# Patient Record
Sex: Female | Born: 1950 | Race: White | Hispanic: No | Smoking: Never smoker
Health system: Southern US, Community
[De-identification: ages and names within clinical notes are randomized; demographics above are authoritative.]

## PROBLEM LIST (undated history)

## (undated) DIAGNOSIS — T884XXA Failed or difficult intubation, initial encounter: Secondary | ICD-10-CM

## (undated) DIAGNOSIS — G4733 Obstructive sleep apnea (adult) (pediatric): Secondary | ICD-10-CM

## (undated) DIAGNOSIS — J45909 Unspecified asthma, uncomplicated: Secondary | ICD-10-CM

## (undated) DIAGNOSIS — L719 Rosacea, unspecified: Secondary | ICD-10-CM

## (undated) DIAGNOSIS — J189 Pneumonia, unspecified organism: Secondary | ICD-10-CM

## (undated) DIAGNOSIS — I499 Cardiac arrhythmia, unspecified: Secondary | ICD-10-CM

## (undated) DIAGNOSIS — E785 Hyperlipidemia, unspecified: Secondary | ICD-10-CM

## (undated) DIAGNOSIS — J40 Bronchitis, not specified as acute or chronic: Secondary | ICD-10-CM

## (undated) DIAGNOSIS — G2581 Restless legs syndrome: Secondary | ICD-10-CM

## (undated) DIAGNOSIS — F329 Major depressive disorder, single episode, unspecified: Secondary | ICD-10-CM

## (undated) DIAGNOSIS — F32A Depression, unspecified: Secondary | ICD-10-CM

## (undated) DIAGNOSIS — F419 Anxiety disorder, unspecified: Secondary | ICD-10-CM

## (undated) DIAGNOSIS — I10 Essential (primary) hypertension: Secondary | ICD-10-CM

## (undated) DIAGNOSIS — K219 Gastro-esophageal reflux disease without esophagitis: Secondary | ICD-10-CM

## (undated) HISTORY — PX: DILATION AND CURETTAGE OF UTERUS: SHX78

## (undated) HISTORY — PX: TONSILLECTOMY: SUR1361

## (undated) HISTORY — DX: Hyperlipidemia, unspecified: E78.5

## (undated) HISTORY — DX: Rosacea, unspecified: L71.9

## (undated) HISTORY — PX: CARDIAC ELECTROPHYSIOLOGY MAPPING AND ABLATION: SHX1292

## (undated) HISTORY — DX: Restless legs syndrome: G25.81

## (undated) HISTORY — PX: CARPAL TUNNEL RELEASE: SHX101

## (undated) HISTORY — DX: Obstructive sleep apnea (adult) (pediatric): G47.33

## (undated) HISTORY — PX: CHOLECYSTECTOMY: SHX55

---

## 1998-12-11 ENCOUNTER — Encounter: Payer: Self-pay | Admitting: Emergency Medicine

## 1998-12-11 ENCOUNTER — Ambulatory Visit (HOSPITAL_COMMUNITY): Admission: RE | Admit: 1998-12-11 | Discharge: 1998-12-11 | Payer: Self-pay | Admitting: Emergency Medicine

## 1999-03-08 ENCOUNTER — Encounter (INDEPENDENT_AMBULATORY_CARE_PROVIDER_SITE_OTHER): Payer: Self-pay | Admitting: Specialist

## 1999-03-08 ENCOUNTER — Observation Stay (HOSPITAL_COMMUNITY): Admission: RE | Admit: 1999-03-08 | Discharge: 1999-03-09 | Payer: Self-pay

## 1999-10-08 ENCOUNTER — Ambulatory Visit: Admission: RE | Admit: 1999-10-08 | Discharge: 1999-10-08 | Payer: Self-pay | Admitting: Emergency Medicine

## 2005-03-24 ENCOUNTER — Encounter: Admission: RE | Admit: 2005-03-24 | Discharge: 2005-03-24 | Payer: Self-pay | Admitting: Internal Medicine

## 2005-11-26 ENCOUNTER — Other Ambulatory Visit: Admission: RE | Admit: 2005-11-26 | Discharge: 2005-11-26 | Payer: Self-pay | Admitting: Obstetrics and Gynecology

## 2006-06-18 ENCOUNTER — Emergency Department (HOSPITAL_COMMUNITY): Admission: EM | Admit: 2006-06-18 | Discharge: 2006-06-18 | Payer: Self-pay | Admitting: Emergency Medicine

## 2006-07-28 DIAGNOSIS — I499 Cardiac arrhythmia, unspecified: Secondary | ICD-10-CM | POA: Diagnosis present

## 2006-07-28 HISTORY — DX: Cardiac arrhythmia, unspecified: I49.9

## 2006-07-30 ENCOUNTER — Encounter: Admission: RE | Admit: 2006-07-30 | Discharge: 2006-07-30 | Payer: Self-pay | Admitting: Internal Medicine

## 2009-03-26 ENCOUNTER — Encounter: Admission: RE | Admit: 2009-03-26 | Discharge: 2009-03-26 | Payer: Self-pay | Admitting: Internal Medicine

## 2009-11-30 ENCOUNTER — Other Ambulatory Visit: Admission: RE | Admit: 2009-11-30 | Discharge: 2009-11-30 | Payer: Self-pay | Admitting: Obstetrics and Gynecology

## 2010-03-21 ENCOUNTER — Ambulatory Visit (HOSPITAL_COMMUNITY): Admission: RE | Admit: 2010-03-21 | Discharge: 2010-03-21 | Payer: Self-pay | Admitting: Gastroenterology

## 2010-07-28 HISTORY — PX: SHOULDER ARTHROSCOPY: SHX128

## 2010-08-18 ENCOUNTER — Encounter: Payer: Self-pay | Admitting: Internal Medicine

## 2010-10-11 LAB — GLUCOSE, CAPILLARY: Glucose-Capillary: 144 mg/dL — ABNORMAL HIGH (ref 70–99)

## 2012-01-30 ENCOUNTER — Other Ambulatory Visit (HOSPITAL_COMMUNITY)
Admission: RE | Admit: 2012-01-30 | Discharge: 2012-01-30 | Disposition: A | Discharge status: Home / Self Care | Payer: BC Managed Care – PPO | Source: Ambulatory Visit | Attending: Obstetrics and Gynecology | Admitting: Obstetrics and Gynecology

## 2012-01-30 DIAGNOSIS — Z01419 Encounter for gynecological examination (general) (routine) without abnormal findings: Secondary | ICD-10-CM | POA: Insufficient documentation

## 2012-02-02 ENCOUNTER — Other Ambulatory Visit: Payer: Self-pay | Admitting: Obstetrics and Gynecology

## 2012-02-27 ENCOUNTER — Encounter (HOSPITAL_COMMUNITY): Payer: Self-pay

## 2012-03-03 ENCOUNTER — Other Ambulatory Visit: Payer: Self-pay

## 2012-03-03 ENCOUNTER — Encounter (HOSPITAL_COMMUNITY): Payer: Self-pay

## 2012-03-03 ENCOUNTER — Other Ambulatory Visit (HOSPITAL_COMMUNITY): Payer: Self-pay | Admitting: Internal Medicine

## 2012-03-03 ENCOUNTER — Other Ambulatory Visit: Payer: Self-pay | Admitting: Obstetrics and Gynecology

## 2012-03-03 ENCOUNTER — Inpatient Hospital Stay (HOSPITAL_COMMUNITY): Admission: RE | Admit: 2012-03-03 | Payer: BC Managed Care – PPO | Source: Ambulatory Visit

## 2012-03-03 ENCOUNTER — Encounter (HOSPITAL_COMMUNITY)
Admission: RE | Admit: 2012-03-03 | Discharge: 2012-03-03 | Disposition: A | Discharge status: Home / Self Care | Payer: BC Managed Care – PPO | Source: Ambulatory Visit | Attending: Obstetrics and Gynecology | Admitting: Obstetrics and Gynecology

## 2012-03-03 DIAGNOSIS — Z1231 Encounter for screening mammogram for malignant neoplasm of breast: Secondary | ICD-10-CM

## 2012-03-03 HISTORY — DX: Essential (primary) hypertension: I10

## 2012-03-03 HISTORY — DX: Major depressive disorder, single episode, unspecified: F32.9

## 2012-03-03 HISTORY — DX: Anxiety disorder, unspecified: F41.9

## 2012-03-03 HISTORY — DX: Unspecified asthma, uncomplicated: J45.909

## 2012-03-03 HISTORY — DX: Cardiac arrhythmia, unspecified: I49.9

## 2012-03-03 HISTORY — DX: Depression, unspecified: F32.A

## 2012-03-03 LAB — BASIC METABOLIC PANEL
CO2: 31 mEq/L (ref 19–32)
Calcium: 9.9 mg/dL (ref 8.4–10.5)
GFR calc non Af Amer: >90 mL/min (ref >90)
Potassium: 4.5 mEq/L (ref 3.5–5.1)
Sodium: 138 mEq/L (ref 135–145)

## 2012-03-03 LAB — CBC
MCH: 31.3 pg (ref 26.0–34.0)
Platelets: 173 10*3/uL (ref 150–400)
RBC: 3.83 MIL/uL — ABNORMAL LOW (ref 3.87–5.11)

## 2012-03-03 NOTE — Patient Instructions (Signed)
Your procedure is scheduled ZO:[id number]  Enter through the Main Entrance at :0945 am Pick up desk phone and dial [phone] and inform us of your arrival.  Please call [phone] if you have any problems the morning of surgery.  Remember: Do not eat after midnight:tonight Do not drink after:7am Thursday  Take these meds the morning of surgery with a sip of water:Metoprolol, Citalopram, Quinapril----HOLD METFORMIN FOR 24 HOURS PRIOR TO SURGERY  DO NOT wear jewelry, eye make-up, lipstick,body lotion, or dark fingernail polish. Do not shave for 48 hours prior to surgery. Patients discharged on the day of surgery will not be allowed to drive home.   Remember to use your Hibiclens as instructed.

## 2012-03-04 ENCOUNTER — Ambulatory Visit (HOSPITAL_COMMUNITY)
Admission: RE | Admit: 2012-03-04 | Discharge: 2012-03-04 | Disposition: A | Discharge status: Home / Self Care | Payer: BC Managed Care – PPO | Source: Ambulatory Visit | Attending: Obstetrics and Gynecology | Admitting: Obstetrics and Gynecology

## 2012-03-04 ENCOUNTER — Encounter (HOSPITAL_COMMUNITY): Payer: Self-pay | Admitting: Anesthesiology

## 2012-03-04 ENCOUNTER — Ambulatory Visit (HOSPITAL_COMMUNITY): Payer: BC Managed Care – PPO | Admitting: Anesthesiology

## 2012-03-04 ENCOUNTER — Encounter (HOSPITAL_COMMUNITY)
Admission: RE | Disposition: A | Discharge status: Home / Self Care | Payer: Self-pay | Source: Ambulatory Visit | Attending: Obstetrics and Gynecology

## 2012-03-04 DIAGNOSIS — Z9889 Other specified postprocedural states: Secondary | ICD-10-CM

## 2012-03-04 DIAGNOSIS — Z01818 Encounter for other preprocedural examination: Secondary | ICD-10-CM | POA: Insufficient documentation

## 2012-03-04 DIAGNOSIS — N950 Postmenopausal bleeding: Secondary | ICD-10-CM | POA: Insufficient documentation

## 2012-03-04 DIAGNOSIS — Z01812 Encounter for preprocedural laboratory examination: Secondary | ICD-10-CM | POA: Insufficient documentation

## 2012-03-04 HISTORY — PX: HYSTEROSCOPY WITH D & C: SHX1775

## 2012-03-04 LAB — URINALYSIS, ROUTINE W REFLEX MICROSCOPIC
Bilirubin Urine: NEGATIVE
Hgb urine dipstick: NEGATIVE
Ketones, ur: NEGATIVE mg/dL
Specific Gravity, Urine: 1.025 (ref 1.005–1.030)
Urobilinogen, UA: 0.2 mg/dL (ref 0.0–1.0)
pH: 5.5 (ref 5.0–8.0)

## 2012-03-04 LAB — GLUCOSE, CAPILLARY: Glucose-Capillary: 104 mg/dL — ABNORMAL HIGH (ref 70–99)

## 2012-03-04 SURGERY — DILATATION AND CURETTAGE /HYSTEROSCOPY
Anesthesia: General | Site: Uterus | Wound class: Clean Contaminated

## 2012-03-04 MED ORDER — MIDAZOLAM HCL 2 MG/2ML IJ SOLN
INTRAMUSCULAR | Status: AC
  Filled 2012-03-04: qty 2

## 2012-03-04 MED ORDER — BUPIVACAINE HCL (PF) 0.25 % IJ SOLN
INTRAMUSCULAR | Status: AC
  Filled 2012-03-04: qty 30

## 2012-03-04 MED ORDER — MIDAZOLAM HCL 5 MG/5ML IJ SOLN
INTRAMUSCULAR | Status: DC | PRN
  Administered 2012-03-04: 2 mg via INTRAVENOUS

## 2012-03-04 MED ORDER — KETOROLAC TROMETHAMINE 30 MG/ML IJ SOLN
INTRAMUSCULAR | Status: AC
  Filled 2012-03-04: qty 1

## 2012-03-04 MED ORDER — ONDANSETRON HCL 4 MG/2ML IJ SOLN
INTRAMUSCULAR | Status: DC | PRN
  Administered 2012-03-04: 4 mg via INTRAVENOUS

## 2012-03-04 MED ORDER — FENTANYL CITRATE 0.05 MG/ML IJ SOLN
INTRAMUSCULAR | Status: AC
  Filled 2012-03-04: qty 2

## 2012-03-04 MED ORDER — IBUPROFEN 200 MG PO TABS: 600.0000 mg | ORAL_TABLET | Freq: Four times a day (QID) | ORAL | Status: AC | PRN

## 2012-03-04 MED ORDER — FENTANYL CITRATE 0.05 MG/ML IJ SOLN
INTRAMUSCULAR | Status: DC | PRN
  Administered 2012-03-04: 100 ug via INTRAVENOUS
  Administered 2012-03-04: 25 ug via INTRAVENOUS

## 2012-03-04 MED ORDER — EPHEDRINE 5 MG/ML INJ
INTRAVENOUS | Status: AC
  Filled 2012-03-04: qty 10

## 2012-03-04 MED ORDER — MEPERIDINE HCL 25 MG/ML IJ SOLN: 6.2500 mg | INTRAMUSCULAR | Status: DC | PRN

## 2012-03-04 MED ORDER — PROPOFOL 10 MG/ML IV BOLUS
INTRAVENOUS | Status: DC | PRN
  Administered 2012-03-04: 150 mg via INTRAVENOUS
  Administered 2012-03-04: 50 mg via INTRAVENOUS

## 2012-03-04 MED ORDER — DOXYCYCLINE HYCLATE 100 MG PO TABS: ORAL_TABLET | ORAL | Status: DC

## 2012-03-04 MED ORDER — ONDANSETRON HCL 4 MG/2ML IJ SOLN: 4.0000 mg | Freq: Once | INTRAMUSCULAR | Status: DC | PRN

## 2012-03-04 MED ORDER — BUPIVACAINE HCL (PF) 0.25 % IJ SOLN
INTRAMUSCULAR | Status: DC | PRN
  Administered 2012-03-04: 20 mL

## 2012-03-04 MED ORDER — EPHEDRINE SULFATE 50 MG/ML IJ SOLN
INTRAMUSCULAR | Status: DC | PRN
  Administered 2012-03-04: 5 mg via INTRAVENOUS

## 2012-03-04 MED ORDER — HYDROCODONE-ACETAMINOPHEN 5-325 MG PO TABS
ORAL_TABLET | ORAL | Status: AC
  Administered 2012-03-04: 2 via ORAL
  Filled 2012-03-04: qty 2

## 2012-03-04 MED ORDER — PROPOFOL 10 MG/ML IV EMUL
INTRAVENOUS | Status: AC
  Filled 2012-03-04: qty 20

## 2012-03-04 MED ORDER — HYDROCODONE-ACETAMINOPHEN 5-500 MG PO TABS: ORAL_TABLET | ORAL | Status: DC

## 2012-03-04 MED ORDER — FENTANYL CITRATE 0.05 MG/ML IJ SOLN
25.0000 ug | INTRAMUSCULAR | Status: DC | PRN
  Administered 2012-03-04: 50 ug via INTRAVENOUS

## 2012-03-04 MED ORDER — LACTATED RINGERS IV SOLN
INTRAVENOUS | Status: DC
  Administered 2012-03-04 (×2): via INTRAVENOUS

## 2012-03-04 MED ORDER — HYDROCODONE-ACETAMINOPHEN 5-325 MG PO TABS
2.0000 | ORAL_TABLET | Freq: Once | ORAL | Status: AC
  Administered 2012-03-04: 2 via ORAL

## 2012-03-04 MED ORDER — KETOROLAC TROMETHAMINE 30 MG/ML IJ SOLN
INTRAMUSCULAR | Status: DC | PRN
  Administered 2012-03-04: 30 mg via INTRAVENOUS

## 2012-03-04 MED ORDER — KETOROLAC TROMETHAMINE 30 MG/ML IJ SOLN: 15.0000 mg | Freq: Once | INTRAMUSCULAR | Status: DC | PRN

## 2012-03-04 MED ORDER — SILVER NITRATE-POT NITRATE 75-25 % EX MISC
CUTANEOUS | Status: AC
  Filled 2012-03-04: qty 2

## 2012-03-04 SURGICAL SUPPLY — 17 items
CANISTER SUCTION 2500CC (MISCELLANEOUS) ×2 IMPLANT
CATH ROBINSON RED A/P 16FR (CATHETERS) ×2 IMPLANT
CLOTH BEACON ORANGE TIMEOUT ST (SAFETY) ×2 IMPLANT
CONTAINER PREFILL 10% NBF 60ML (FORM) ×4 IMPLANT
DILATOR CANAL MILEX (MISCELLANEOUS) ×1 IMPLANT
ELECT REM PT RETURN 9FT ADLT (ELECTROSURGICAL) ×2
ELECTRODE REM PT RTRN 9FT ADLT (ELECTROSURGICAL) ×1 IMPLANT
GLOVE BIOGEL M 6.5 STRL (GLOVE) ×2 IMPLANT
GLOVE BIOGEL PI IND STRL 6.5 (GLOVE) ×2 IMPLANT
GLOVE BIOGEL PI INDICATOR 6.5 (GLOVE) ×3
GOWN PREVENTION PLUS LG XLONG (DISPOSABLE) ×2 IMPLANT
GOWN PREVENTION PLUS XLARGE (GOWN DISPOSABLE) ×2 IMPLANT
GOWN STRL REIN XL XLG (GOWN DISPOSABLE) ×2 IMPLANT
LOOP ANGLED CUTTING 22FR (CUTTING LOOP) IMPLANT
PACK HYSTEROSCOPY LF (CUSTOM PROCEDURE TRAY) ×2 IMPLANT
TOWEL OR 17X24 6PK STRL BLUE (TOWEL DISPOSABLE) ×4 IMPLANT
WATER STERILE IRR 1000ML POUR (IV SOLUTION) ×2 IMPLANT

## 2012-03-04 NOTE — Anesthesia Preprocedure Evaluation (Signed)
Anesthesia Evaluation  Patient identified by MRN, date of birth, ID band Patient awake    Reviewed: Allergy & Precautions, H&P , Patient's Chart, lab work & pertinent test results, reviewed documented beta blocker date and time   Airway Mallampati: I TM Distance: >3 FB Neck ROM: full    Dental  (+) Teeth Intact   Pulmonary  breath sounds clear to auscultation  Pulmonary exam normal       Cardiovascular hypertension, Pt. on home beta blockers     Neuro/Psych PSYCHIATRIC DISORDERS Anxiety Depression negative neurological ROS     GI/Hepatic negative GI ROS, Neg liver ROS,   Endo/Other  Well Controlled, Type 2, Oral Hypoglycemic AgentsMorbid obesity  Renal/GU negative Renal ROS  negative genitourinary   Musculoskeletal negative musculoskeletal ROS (+)   Abdominal (+) + obese,   Peds negative pediatric ROS (+)  Hematology negative hematology ROS (+)   Anesthesia Other Findings   Reproductive/Obstetrics negative OB ROS                           Anesthesia Physical Anesthesia Plan  ASA: III  Anesthesia Plan: General   Post-op Pain Management:    Induction: Intravenous  Airway Management Planned: LMA  Additional Equipment:   Intra-op Plan:   Post-operative Plan:   Informed Consent: I have reviewed the patients History and Physical, chart, labs and discussed the procedure including the risks, benefits and alternatives for the proposed anesthesia with the patient or authorized representative who has indicated his/her understanding and acceptance.   Dental Advisory Given  Plan Discussed with: CRNA, Surgeon and Anesthesiologist  Anesthesia Plan Comments:         Anesthesia Quick Evaluation

## 2012-03-04 NOTE — Anesthesia Procedure Notes (Signed)
Procedure Name: LMA Insertion Date/Time: 03/04/2012 11:27 AM Performed by: Alaijah Gibler, Jannet Askew Pre-anesthesia Checklist: Patient identified, Timeout performed, Emergency Drugs available, Suction available and Patient being monitored Oxygen Delivery Method: Circle system utilized Preoxygenation: Pre-oxygenation with 100% oxygen Intubation Type: IV induction Ventilation: Mask ventilation without difficulty LMA: LMA inserted LMA Size: 4.0 Number of attempts: 1 Placement Confirmation: positive ETCO2 and breath sounds checked- equal and bilateral Dental Injury: Teeth and Oropharynx as per pre-operative assessment

## 2012-03-04 NOTE — Transfer of Care (Signed)
Immediate Anesthesia Transfer of Care Note  Patient: [name] [name]  Procedure(s) Performed: Procedure(s) (LRB): DILATATION AND CURETTAGE /HYSTEROSCOPY (N/A)  Patient Location: PACU  Anesthesia Type: General  Level of Consciousness: awake, alert  and oriented  Airway & Oxygen Therapy: Patient Spontanous Breathing and Patient connected to nasal cannula oxygen  Post-op Assessment: Report given to PACU RN and Post -op Vital signs reviewed and stable  Post vital signs: Reviewed and stable  Complications: No apparent anesthesia complications

## 2012-03-04 NOTE — H&P (Signed)
Date of Initial H&P: 02/24/2012 History reviewed, patient examined, no change in status, stable for surgery.

## 2012-03-04 NOTE — Anesthesia Postprocedure Evaluation (Signed)
  Anesthesia Post-op Note  Patient: [name] [name]  Procedure(s) Performed: Procedure(s) (LRB): DILATATION AND CURETTAGE /HYSTEROSCOPY (N/A)  Patient Location: PACU  Anesthesia Type: General  Level of Consciousness: awake, alert  and oriented  Airway and Oxygen Therapy: Patient Spontanous Breathing  Post-op Pain: none  Post-op Assessment: Post-op Vital signs reviewed, Patient's Cardiovascular Status Stable, Respiratory Function Stable, Patent Airway, No signs of Nausea or vomiting and Pain level controlled  Post-op Vital Signs: Reviewed and stable  Complications: No apparent anesthesia complications

## 2012-03-04 NOTE — H&P (Signed)
03/04/2012  12:29 PM  PATIENT:  [name] [name]  61 y.o. female  PRE-OPERATIVE DIAGNOSIS:  Post Menopausal Bleeding  POST-OPERATIVE DIAGNOSIS:  Post Menopausal Bleeding  PROCEDURE:  Procedure(s) (LRB): DILATATION AND CURETTAGE /HYSTEROSCOPY (N/A)  SURGEON:  Surgeon(s) and Role:    * Allisha Harter J. Richardson Dopp, MD - Primary  PHYSICIAN ASSISTANT:   ASSISTANTS: none   ANESTHESIA:   general  EBL:  Total I/O In: 1200 [I.V.:1200] Out: -   BLOOD ADMINISTERED:none  DRAINS: none   LOCAL MEDICATIONS USED:  MARCAINE     SPECIMEN:  Source of Specimen:  endometrial currettings  DISPOSITION OF SPECIMEN:  PATHOLOGY  COUNTS:  YES  TOURNIQUET:  * No tourniquets in log *  DICTATION: .Dragon Dictation  PLAN OF CARE: Discharge to home after PACU  PATIENT DISPOSITION:  PACU - hemodynamically stable.   Delay start of Pharmacological VTE agent (>24hrs) due to surgical blood loss or risk of bleeding: not applicable   Findings: Atrophic appearing endometrium   Procedure: Patient was taken to the operating room where she was placed under general anesthesia. She was placed in the dorsal lithotomy position. She was prepped and draped in the usual sterile fashion. A speculum was placed into the vaginal vault. The anterior lip of the cervix was grasped with a single-tooth tenaculum. Quarter percent Marcaine was injected at the 4 and 8:00 positions of the cervix. The cervix was then sounded to 4 cm. The cervix was dilated to approximately 4 mm. Diagnostic hysteroscope was inserted.  The endomtrium appeared atrophic .Marland Kitchen No masses were noted.  The hysteroscope was removed. Sharp curette was introduced and minimal endometrial curettings were obtained. The hysteroscope was then reinserted. There was no evidence of perforation. Hysteroscope was then removed. The single-tooth tenaculum was removed from the anterior lip of the cervix. Excellent hemostasis was noted The speculum was removed from the patient's vagina. She was  awakened from anesthesia taken care at country room awake and in stable condition. Sponge lap and needle counts were correct x2.

## 2012-04-08 ENCOUNTER — Ambulatory Visit (HOSPITAL_COMMUNITY): Payer: BC Managed Care – PPO

## 2012-04-08 ENCOUNTER — Ambulatory Visit (HOSPITAL_COMMUNITY)
Admission: RE | Admit: 2012-04-08 | Discharge: 2012-04-08 | Disposition: A | Discharge status: Home / Self Care | Payer: BC Managed Care – PPO | Source: Ambulatory Visit | Attending: Internal Medicine | Admitting: Internal Medicine

## 2012-04-08 DIAGNOSIS — Z1231 Encounter for screening mammogram for malignant neoplasm of breast: Secondary | ICD-10-CM

## 2012-04-17 NOTE — Op Note (Signed)
/  02/2012  12:29 PM  PATIENT: [name] [name] 61 y.o. female  PRE-OPERATIVE DIAGNOSIS: Post Menopausal Bleeding  POST-OPERATIVE DIAGNOSIS: Post Menopausal Bleeding  PROCEDURE: Procedure(s) (LRB):  DILATATION AND CURETTAGE /HYSTEROSCOPY (N/A)  SURGEON: Surgeon(s) and Role:  * Mylasia Vorhees J. Richardson Dopp, MD - Primary  PHYSICIAN ASSISTANT:  ASSISTANTS: none  ANESTHESIA: general  EBL: Total I/O  In: 1200 [I.V.:1200]  Out: -  BLOOD ADMINISTERED:none  DRAINS: none  LOCAL MEDICATIONS USED: MARCAINE  SPECIMEN: Source of Specimen: endometrial currettings  DISPOSITION OF SPECIMEN: PATHOLOGY  COUNTS: YES  TOURNIQUET: * No tourniquets in log *  DICTATION: .Dragon Dictation  PLAN OF CARE: Discharge to home after PACU  PATIENT DISPOSITION: PACU - hemodynamically stable.  Delay start of Pharmacological VTE agent (>24hrs) due to surgical blood loss or risk of bleeding: not applicable  Findings: Atrophic appearing endometrium  Procedure: Patient was taken to the operating room where she was placed under general anesthesia. She was placed in the dorsal lithotomy position. She was prepped and draped in the usual sterile fashion. A speculum was placed into the vaginal vault. The anterior lip of the cervix was grasped with a single-tooth tenaculum. Quarter percent Marcaine was injected at the 4 and 8:00 positions of the cervix. The cervix was then sounded to 4 cm. The cervix was dilated to approximately 4 mm. Diagnostic hysteroscope was inserted. The endomtrium appeared atrophic .Marland Kitchen No masses were noted. The hysteroscope was removed. Sharp curette was introduced and minimal endometrial curettings were obtained. The hysteroscope was then reinserted. There was no evidence of perforation. Hysteroscope was then removed. The single-tooth tenaculum was removed from the anterior lip of the cervix. Excellent hemostasis was noted The speculum was removed from the patient's vagina. She was awakened from anesthesia taken care at country room  awake and in stable condition. Sponge lap and needle counts were correct x2.

## 2012-10-06 ENCOUNTER — Other Ambulatory Visit: Payer: Self-pay | Admitting: Internal Medicine

## 2012-10-06 DIAGNOSIS — M545 Low back pain: Secondary | ICD-10-CM

## 2012-10-10 ENCOUNTER — Ambulatory Visit
Admission: RE | Admit: 2012-10-10 | Discharge: 2012-10-10 | Disposition: A | Discharge status: Home / Self Care | Payer: BC Managed Care – PPO | Source: Ambulatory Visit | Attending: Internal Medicine | Admitting: Internal Medicine

## 2012-10-10 DIAGNOSIS — M545 Low back pain: Secondary | ICD-10-CM

## 2012-10-22 ENCOUNTER — Encounter: Payer: Self-pay | Admitting: Internal Medicine

## 2012-10-25 ENCOUNTER — Encounter: Payer: Self-pay | Admitting: Internal Medicine

## 2012-10-25 ENCOUNTER — Ambulatory Visit (INDEPENDENT_AMBULATORY_CARE_PROVIDER_SITE_OTHER): Payer: BC Managed Care – PPO | Admitting: Internal Medicine

## 2012-10-25 VITALS — BP 138/88 | HR 96 | Temp 97.7°F | Ht 64.0 in | Wt 236.0 lb

## 2012-10-25 DIAGNOSIS — R05 Cough: Secondary | ICD-10-CM

## 2012-10-25 DIAGNOSIS — I10 Essential (primary) hypertension: Secondary | ICD-10-CM

## 2012-10-25 MED ORDER — OLMESARTAN MEDOXOMIL-HCTZ 40-25 MG PO TABS: ORAL_TABLET | ORAL | Status: DC

## 2012-10-25 NOTE — Patient Instructions (Addendum)
benicar 40/25  One half daily  Stop quinapril  GERD (REFLUX)  is an extremely common cause of respiratory symptoms(which may mimic the effect of your blood pressure pill), many times with no significant heartburn at all.    It can be treated with medication, but also with lifestyle changes including avoidance of late meals, excessive alcohol, smoking cessation, and avoid fatty foods, chocolate, peppermint, colas, red wine, and acidic juices such as orange juice.  NO MINT OR MENTHOL PRODUCTS SO NO COUGH DROPS  USE SUGARLESS CANDY INSTEAD (jolley ranchers or Stover's)  NO OIL BASED VITAMINS - use powdered substitutes.  Please schedule a follow up office visit in 4 weeks, sooner if needed

## 2012-10-25 NOTE — Assessment & Plan Note (Addendum)
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   These are the same pts (now being labeled as having "irritable larynx syndrome" by some cough centers) who not infrequently have a history of having failed to tolerate ace inhibitors,  dry powder inhalers or biphosphonates or report having atypical reflux symptoms that don't respond to standard doses of PPI , and are easily confused as having aecopd or asthma flares by even experienced allergists/ pulmonologists.   Will try off acei and rx short term gerd diet then regroup in 4 weeks

## 2012-10-25 NOTE — Progress Notes (Signed)
  Subjective:    Patient ID: [name] [name], female    DOB: 09-Jan-1951 MRN: [id number]  HPI  62 yowf never smoker good ex tolerance at baseline wt 160 with new onset doe x 2008 referred 10/25/2012 to pulmonary clinic by Dr Donette Larry   10/25/2012 1st pulmonary eval on ACEi cc gradual worse indolent onset sob x  2008 and in addition several bad spells of sob with dx of pna rx as outpt with short term inhalers albuterol which seemed to help some - assoc with hoarseness.  Wt gain attributed to freq prednisone rx multiple times for back pain does not help breathing.  Min assoc chronic dry cough  More day than night when does cough.   No obvious daytime variabilty or assoc   cp or chest tightness, subjective wheeze overt sinus or hb symptoms. No unusual exp hx or h/o childhood pna/ asthma or premature birth to herknowledge.   Sleeping ok without nocturnal  or early am exacerbation  of respiratory  c/o's or need for noct saba. Also denies any obvious fluctuation of symptoms with weather or environmental changes or other aggravating or alleviating factors except as outlined above     Review of Systems  Constitutional: Negative for fever, chills and unexpected weight change.  HENT: Negative for ear pain, nosebleeds, congestion, sore throat, rhinorrhea, sneezing, trouble swallowing, dental problem, voice change, postnasal drip and sinus pressure.   Eyes: Negative for visual disturbance.  Respiratory: Positive for shortness of breath. Negative for cough and choking.   Cardiovascular: Negative for chest pain and leg swelling.  Gastrointestinal: Negative for vomiting, abdominal pain and diarrhea.  Genitourinary: Negative for difficulty urinating.  Musculoskeletal: Negative for arthralgias.  Skin: Negative for rash.  Neurological: Negative for tremors, syncope and headaches.  Hematological: Does not bruise/bleed easily.       Objective:   Physical Exam Wt Readings from Last 3 Encounters:  10/25/12 236 lb  (107.049 kg)  03/03/12 227 lb (102.967 kg)    amb hoarse wf nad with classic voice fatigue and hoarseness  HEENT: nl dentition, turbinates, and orophanx. Nl external ear canals without cough reflex   NECK :  without JVD/Nodes/TM/ nl carotid upstrokes bilaterally   LUNGS: no acc muscle use, clear to A and P bilaterally without cough on insp or exp maneuvers   CV:  RRR  no s3 or murmur or increase in P2, no edema   ABD:  soft and nontender with nl excursion in the supine position. No bruits or organomegaly, bowel sounds nl  MS:  warm without deformities, calf tenderness, cyanosis or clubbing  SKIN: warm and dry without lesions    NEURO:  alert, approp, no deficits           Assessment & Plan:

## 2012-10-25 NOTE — Assessment & Plan Note (Signed)

## 2012-12-06 ENCOUNTER — Ambulatory Visit (INDEPENDENT_AMBULATORY_CARE_PROVIDER_SITE_OTHER): Payer: BC Managed Care – PPO | Admitting: Internal Medicine

## 2012-12-06 ENCOUNTER — Encounter: Payer: Self-pay | Admitting: Internal Medicine

## 2012-12-06 VITALS — BP 138/80 | HR 93 | Temp 97.7°F | Ht 64.0 in | Wt 236.0 lb

## 2012-12-06 DIAGNOSIS — R05 Cough: Secondary | ICD-10-CM

## 2012-12-06 DIAGNOSIS — I10 Essential (primary) hypertension: Secondary | ICD-10-CM

## 2012-12-06 MED ORDER — OLMESARTAN MEDOXOMIL-HCTZ 40-25 MG PO TABS: ORAL_TABLET | ORAL | Status: DC

## 2012-12-06 NOTE — Patient Instructions (Addendum)
Try pepcid  20 mg at bedtime for about a month to see if it helps your am throat clearing.  Continue benicar and let me know if insurance covers ok or not.  See [name] NP for substitutes or Dr Donette Larry but avoid Ace inhbitor as a class due to your airway issues   If you are satisfied with your treatment plan let your doctor know and he/she can either refill your medications or you can return here when your prescription runs out.     If in any way you are not 100% satisfied,  please tell us.  If 100% better, tell your friends!

## 2012-12-06 NOTE — Assessment & Plan Note (Signed)
Response to stopping acei strongly supports dx of  Classic Upper airway cough syndrome, so named because it's frequently impossible to sort out how much is  CR/sinusitis with freq throat clearing (which can be related to primary GERD)   vs  causing  secondary (" extra esophageal")  GERD from wide swings in gastric pressure that occur with throat clearing, often  promoting self use of mint and menthol lozenges that reduce the lower esophageal sphincter tone and exacerbate the problem further in a cyclical fashion.   These are the same pts (now being labeled as having "irritable larynx syndrome" by some cough centers) who not infrequently have a history of having failed to tolerate ace inhibitors as is clearly the case here,  dry powder inhalers or biphosphonates or report having atypical reflux symptoms that don't respond to standard doses of PPI , and are easily confused as having aecopd or asthma flares by even experienced allergists/ pulmonologists.  For now add hs h2 to see if any effect on am cough and f/u prn

## 2012-12-06 NOTE — Assessment & Plan Note (Signed)
Adequate control on present rx, reviewed options but would avoid acei given the convincing improvement off acei.  See instructions for specific recommendations which were reviewed directly with the patient who was given a copy with highlighter outlining the key components.

## 2012-12-06 NOTE — Progress Notes (Signed)
  Subjective:    Patient ID: [name] [name], female    DOB: 19-Jan-1951 MRN: [id number]  HPI  69 yowf never smoker good ex tolerance at baseline wt 160 with new onset doe x 2008 referred 10/25/2012 to pulmonary clinic by Dr Donette Larry   10/25/2012 1st pulmonary eval on ACEi cc gradual worse indolent onset sob x  2008 and in addition several bad spells of sob with dx of pna rx as outpt with short term inhalers albuterol which seemed to help some - assoc with hoarseness.  Wt gain attributed to freq prednisone rx multiple times for back pain does not help breathing.  Min assoc chronic dry cough  More day than night when does cough.  rec benicar 40/25  One half daily Stop quinapril GERD diet   12/06/2012 f/u ov/Wert re sob and cough Chief Complaint  Patient presents with  . Follow-up    Breathing is much improved since the last visit.  Cough has also improved, but still has occ non prod cough-worse during her work day.   took cruise, fine with walking, still occ urge to clear throat esp in am ? Worse at work also    No obvious daytime variabilty or assoc   cp or chest tightness, subjective wheeze overt sinus or hb symptoms. No unusual exp hx or h/o childhood pna/ asthma or premature birth to herknowledge.   Sleeping ok without nocturnal  or early am exacerbation  of respiratory  c/o's or need for noct saba. Also denies any obvious fluctuation of symptoms with weather or environmental changes or other aggravating or alleviating factors except as outlined above   Current Medications, Allergies, Past Medical History, Past Surgical History, Family History, and Social History were reviewed in [employer] record.  ROS  The following are not active complaints unless bolded sore throat, dysphagia, dental problems, itching, sneezing,  nasal congestion or excess/ purulent secretions, ear ache,   fever, chills, sweats, unintended wt loss, pleuritic or exertional cp, hemoptysis,  orthopnea  pnd or leg swelling, presyncope, palpitations, heartburn, abdominal pain, anorexia, nausea, vomiting, diarrhea  or change in bowel or urinary habits, change in stools or urine, dysuria,hematuria,  rash, arthralgias, visual complaints, headache, numbness weakness or ataxia or problems with walking or coordination,  change in mood/affect or memory.               Objective:   Physical Exam  12/06/2012        236  Wt Readings from Last 3 Encounters:  10/25/12 236 lb (107.049 kg)  03/03/12 227 lb (102.967 kg)    amb hoarse wf nad with classic voice fatigue and hoarseness  HEENT: nl dentition, turbinates, and orophanx. Nl external ear canals without cough reflex   NECK :  without JVD/Nodes/TM/ nl carotid upstrokes bilaterally   LUNGS: no acc muscle use, clear to A and P bilaterally without cough on insp or exp maneuvers   CV:  RRR  no s3 or murmur or increase in P2, no edema   ABD:  soft and nontender with nl excursion in the supine position. No bruits or organomegaly, bowel sounds nl  MS:  warm without deformities, calf tenderness, cyanosis or clubbing  SKIN: warm and dry without lesions               Assessment & Plan:

## 2013-02-01 ENCOUNTER — Other Ambulatory Visit: Payer: Self-pay | Admitting: Obstetrics and Gynecology

## 2013-02-01 ENCOUNTER — Other Ambulatory Visit (HOSPITAL_COMMUNITY)
Admission: RE | Admit: 2013-02-01 | Discharge: 2013-02-01 | Disposition: A | Discharge status: Home / Self Care | Payer: BC Managed Care – PPO | Source: Ambulatory Visit | Attending: Obstetrics and Gynecology | Admitting: Obstetrics and Gynecology

## 2013-02-01 DIAGNOSIS — Z1151 Encounter for screening for human papillomavirus (HPV): Secondary | ICD-10-CM | POA: Insufficient documentation

## 2013-02-01 DIAGNOSIS — Z01419 Encounter for gynecological examination (general) (routine) without abnormal findings: Secondary | ICD-10-CM | POA: Insufficient documentation

## 2013-05-05 ENCOUNTER — Ambulatory Visit
Admission: RE | Admit: 2013-05-05 | Discharge: 2013-05-05 | Disposition: A | Discharge status: Home / Self Care | Payer: BC Managed Care – PPO | Source: Ambulatory Visit | Attending: Nurse Practitioner | Admitting: Nurse Practitioner

## 2013-05-05 ENCOUNTER — Other Ambulatory Visit: Payer: Self-pay | Admitting: Nurse Practitioner

## 2013-05-05 DIAGNOSIS — R05 Cough: Secondary | ICD-10-CM

## 2013-05-05 DIAGNOSIS — J40 Bronchitis, not specified as acute or chronic: Secondary | ICD-10-CM

## 2013-05-19 ENCOUNTER — Encounter: Payer: Self-pay | Admitting: Internal Medicine

## 2013-05-19 ENCOUNTER — Ambulatory Visit (INDEPENDENT_AMBULATORY_CARE_PROVIDER_SITE_OTHER): Payer: BC Managed Care – PPO | Admitting: Internal Medicine

## 2013-05-19 VITALS — BP 124/82 | HR 90 | Temp 97.5°F | Ht 63.0 in | Wt 223.0 lb

## 2013-05-19 DIAGNOSIS — R05 Cough: Secondary | ICD-10-CM

## 2013-05-19 MED ORDER — FAMOTIDINE 20 MG PO TABS: ORAL_TABLET | ORAL | Status: DC

## 2013-05-19 MED ORDER — PANTOPRAZOLE SODIUM 40 MG PO TBEC: 40.0000 mg | DELAYED_RELEASE_TABLET | Freq: Every day | ORAL | Status: DC

## 2013-05-19 MED ORDER — PREDNISONE (PAK) 10 MG PO TABS: ORAL_TABLET | ORAL | Status: DC

## 2013-05-19 MED ORDER — TRAMADOL HCL 50 MG PO TABS: ORAL_TABLET | ORAL | Status: DC

## 2013-05-19 NOTE — Patient Instructions (Addendum)
Increase neurontin to three times daily until cough is gone, then resume bedtime only  The key to effective treatment for your cough is eliminating the non-stop cycle of cough you're stuck in long enough to let your airway heal completely and then see if there is anything still making you cough once you stop the cough suppression, but this should take no more than 5 days to figure out  First take delsym two tsp every 12 hours and supplement if needed with  tramadol 50 mg up to 2 every 4 hours to suppress the urge to cough at all or even clear your throat. Swallowing water or using ice chips/non mint and menthol containing candies (such as lifesavers or sugarless jolly ranchers) are also effective.  You should rest your voice and avoid activities that you know make you cough.  Once you have eliminated the cough for 3 straight days try reducing the tramadol first,  then the delsym as tolerated.    Pantoprazole (protonix) 40 mg   Take 30-60 min before first meal of the day and Pepcid 20 mg one bedtime until return to office - this is the best way to tell whether stomach acid is contributing to your problem.   GERD (REFLUX)  is an extremely common cause of respiratory symptoms, many times with no significant heartburn at all.    It can be treated with medication, but also with lifestyle changes including avoidance of late meals, excessive alcohol, smoking cessation, and avoid fatty foods, chocolate, peppermint, colas, red wine, and acidic juices such as orange juice.  NO MINT OR MENTHOL PRODUCTS SO NO COUGH DROPS  USE SUGARLESS CANDY INSTEAD (jolley ranchers or Stover's)  NO OIL BASED VITAMINS - use powdered substitutes.    Prednisone 10 mg take  4 each am x 2 days,   2 each am x 2 days,  1 each am x 2 days and stop    Please schedule a follow up office visit in 2 weeks, sooner if needed

## 2013-05-19 NOTE — Progress Notes (Signed)
Subjective:    Patient ID: [name] [name], female    DOB: April 19, 1951 MRN: [id number]    Brief patient profile:  57 yowf never smoker good ex tolerance at baseline wt 160 with new onset doe x 2008 referred 10/25/2012 to pulmonary clinic by [name] [name]   10/25/2012 1st pulmonary eval on ACEi cc gradual worse indolent onset sob x  2008 and in addition several bad spells of sob with dx of pna rx as outpt with short term inhalers albuterol which seemed to help some - assoc with hoarseness.  Wt gain attributed to freq prednisone rx multiple times for back pain does not help breathing.  Min assoc chronic dry cough  More day than night when does cough.  rec benicar 40/25  One half daily Stop quinapril GERD diet   12/06/2012 f/u ov/[name] [name] re sob and cough Chief Complaint  Patient presents with  . Follow-up    Breathing is much improved since the last visit.  Cough has also improved, but still has occ non prod cough-worse during her work day.   took cruise, fine with walking, still occ urge to clear throat esp in am ? Worse at work also rec Try pepcid  20 mg at bedtime for about a month to see if it helps your am throat clearing. Continue benicar and let me know if insurance covers ok or not.  See [name] [name] for substitutes or [name] [name] but avoid Ace inhbitor as a class due to your airway issues   05/19/2013 f/u ov/[name] [name] re: recurrent cough now off acei  And pepcid which corrected 100% Chief Complaint  Patient presents with  . Follow-up    Pt c/o increased cough x 7 wks- non prod and worse in the evening and at night. Having trouble sleeping. She states that she has been through 2 rounds and abx and prednisone with some relief. She is coughing to the point of vomiting about twice per day.    worst around 4 pm whether at work or not then at hs and all night long, no better with saba or qvar Most the time dry  Only sob when cough  Acute onset with burning in throat  Started HRT 3 weeks prior to OV    Coughs so hard urinates    No obvious daytime variabilty or assoc   cp or chest tightness, subjective wheeze overt sinus or hb symptoms. No unusual exp hx or h/o childhood pna/ asthma or premature birth to herknowledge.    . Also denies any obvious fluctuation of symptoms with weather or environmental changes or other aggravating or alleviating factors except as outlined above   Current Medications, Allergies, Past Medical History, Past Surgical History, Family History, and Social History were reviewed in [employer] record.  ROS  The following are not active complaints unless bolded sore throat, dysphagia, dental problems, itching, sneezing,  nasal congestion or excess/ purulent secretions, ear ache,   fever, chills, sweats, unintended wt loss, pleuritic or exertional cp, hemoptysis,  orthopnea pnd or leg swelling, presyncope, palpitations, heartburn, abdominal pain, anorexia, nausea, vomiting, diarrhea  or change in bowel or urinary habits, change in stools or urine, dysuria,hematuria,  rash, arthralgias, visual complaints, headache, numbness weakness or ataxia or problems with walking or coordination,  change in mood/affect or memory.               Objective:   Physical Exam  12/06/2012        236  > 05/19/2013  223  Wt Readings from Last 3 Encounters:  10/25/12 236 lb (107.049 kg)  03/03/12 227 lb (102.967 kg)    amb hoarse wf nad with classic voice fatigue and hoarseness  HEENT: nl dentition, turbinates, and orophanx. Nl external ear canals without cough reflex   NECK :  without JVD/Nodes/TM/ nl carotid upstrokes bilaterally   LUNGS: no acc muscle use, clear to A and P bilaterally with cough on  exp maneuvers   CV:  RRR  no s3 or murmur or increase in P2, no edema   ABD:  soft and nontender with nl excursion in the supine position. No bruits or organomegaly, bowel sounds nl  MS:  warm without deformities, calf tenderness, cyanosis or  clubbing  SKIN: warm and dry without lesions       05/05/13 No active cardiopulmonary disease.         Assessment & Plan:

## 2013-05-19 NOTE — Assessment & Plan Note (Signed)
The most common causes of chronic cough in immunocompetent adults include the following: upper airway cough syndrome (UACS), previously referred to as postnasal drip syndrome (PNDS), which is caused by variety of rhinosinus conditions; (2) asthma; (3) GERD; (4) chronic bronchitis from cigarette smoking or other inhaled environmental irritants; (5) nonasthmatic eosinophilic bronchitis; and (6) bronchiectasis.   These conditions, singly or in combination, have accounted for up to 94% of the causes of chronic cough in prospective studies.   Other conditions have constituted no >6% of the causes in prospective studies These have included bronchogenic carcinoma, chronic interstitial pneumonia, sarcoidosis, left ventricular failure, ACEI-induced cough, and aspiration from a condition associated with pharyngeal dysfunction.    Chronic cough is often simultaneously caused by more than one condition. A single cause has been found from 38 to 82% of the time, multiple causes from 18 to 62%. Multiply caused cough has been the result of three diseases up to 42% of the time.      Most likely this is  Classic Upper airway cough syndrome, so named because it's frequently impossible to sort out how much is  CR/sinusitis with freq throat clearing (which can be related to primary GERD)   vs  causing  secondary (" extra esophageal")  GERD from wide swings in gastric pressure that occur with throat clearing, often  promoting self use of mint and menthol lozenges that reduce the lower esophageal sphincter tone and exacerbate the problem further in a cyclical fashion.   These are the same pts (now being labeled as having "irritable larynx syndrome" by some cough centers) who not infrequently have a history of having failed to tolerate ace inhibitors,  dry powder inhalers or biphosphonates or report having atypical reflux symptoms that don't respond to standard doses of PPI , and are easily confused as having aecopd or asthma  flares by even experienced allergists/ pulmonologists.  Since can't take ppi try bid pepcid/ diet/ off HRT/ cyclical cough regimen and regroup in 2 weeks if not better  See instructions for specific recommendations which were reviewed directly with the patient who was given a copy with highlighter outlining the key components.

## 2013-06-01 ENCOUNTER — Other Ambulatory Visit: Payer: Self-pay | Admitting: Internal Medicine

## 2013-06-01 ENCOUNTER — Encounter: Payer: Self-pay | Admitting: Internal Medicine

## 2013-06-01 ENCOUNTER — Ambulatory Visit (INDEPENDENT_AMBULATORY_CARE_PROVIDER_SITE_OTHER): Payer: BC Managed Care – PPO | Admitting: Internal Medicine

## 2013-06-01 VITALS — BP 110/74 | HR 91 | Temp 97.9°F | Ht 63.0 in | Wt 224.2 lb

## 2013-06-01 DIAGNOSIS — I10 Essential (primary) hypertension: Secondary | ICD-10-CM

## 2013-06-01 DIAGNOSIS — R05 Cough: Secondary | ICD-10-CM

## 2013-06-01 MED ORDER — TRAMADOL HCL 50 MG PO TABS: ORAL_TABLET | ORAL | Status: DC

## 2013-06-01 MED ORDER — PREDNISONE (PAK) 10 MG PO TABS: ORAL_TABLET | ORAL | Status: DC

## 2013-06-01 MED ORDER — MOMETASONE FURO-FORMOTEROL FUM 100-5 MCG/ACT IN AERO: INHALATION_SPRAY | RESPIRATORY_TRACT | Status: DC

## 2013-06-01 NOTE — Telephone Encounter (Signed)
Pt just had this filled on 10/23 OK to refill now? Please advise, thanks

## 2013-06-01 NOTE — Telephone Encounter (Signed)
Already done at [facility]

## 2013-06-01 NOTE — Progress Notes (Signed)
Subjective:    Patient ID: [name] [name], female    DOB: [birth year] MRN: [id number]    Brief patient profile:  77 yowf never smoker good ex tolerance at baseline wt 160 with new onset doe x 2008 referred 10/25/2012 to pulmonary clinic by Dr Donette Larry  History of Present Illness  10/25/2012 1st pulmonary eval on ACEi cc gradual worse indolent onset sob x  2008 and in addition several bad spells of sob with dx of pna rx as outpt with short term inhalers albuterol which seemed to help some - assoc with hoarseness.  Wt gain attributed to freq prednisone rx multiple times for back pain does not help breathing.  Min assoc chronic dry cough  More day than night when does cough.  rec benicar 40/25  One half daily Stop quinapril GERD diet   12/06/2012 f/u ov/Hadas Jessop re sob and cough Chief Complaint  Patient presents with  . Follow-up    Breathing is much improved since the last visit.  Cough has also improved, but still has occ non prod cough-worse during her work day.   took cruise, fine with walking, still occ urge to clear throat esp in am ? Worse at work also rec Try pepcid  20 mg at bedtime for about a month to see if it helps your am throat clearing. Continue benicar and let me know if insurance covers ok or not.  See [name] NP for substitutes or Dr Donette Larry but avoid Ace inhbitor as a class due to your airway issues   05/19/2013 f/u ov/Deniese Oberry re: recurrent cough now off acei  And pepcid which corrected 100% Chief Complaint  Patient presents with  . Follow-up    Pt c/o increased cough x 7 wks- non prod and worse in the evening and at night. Having trouble sleeping. She states that she has been through 2 rounds and abx and prednisone with some relief. She is coughing to the point of vomiting about twice per day.    worst around 4 pm whether at work or not then at hs and all night long, no better with saba or qvar Most the time dry Only sob when cough  Acute onset with burning in throat  Started HRT 3  weeks prior to OV   Coughs so hard urinates also rec  Increase neurontin to three times daily until cough is gone, then resume bedtime only First take delsym two tsp every 12 hours and supplement if needed with  tramadol 50 mg up to 2 every 4 hours to suppress the urge to cough at all or even clear your throat.   Pantoprazole (protonix) 40 mg   Take 30-60 min before first meal of the day and Pepcid 20 mg one bedtime until return to office - this is the best way to tell whether stomach acid is contributing to your problem.   GERD diet  Prednisone 10 mg take  4 each am x 2 days,   2 each am x 2 days,  1 each am x 2 days and stop      06/01/2013 f/u ov/Jolleen Seman re: chronic cough Chief Complaint  Patient presents with  . Follow-up    Pt states cough is some improved, but not much. Cough is prod in the am with minimal clear sputum.       cough worse at 3 am and better while on prednisone better p saba   No obvious daytime variabilty or assoc   cp or chest tightness, subjective wheeze  overt sinus or hb symptoms. No unusual exp hx or h/o childhood pna/ asthma or premature birth to herknowledge.    Also denies any obvious fluctuation of symptoms with weather or environmental changes or other aggravating or alleviating factors except as outlined above   Current Medications, Allergies, Past Medical History, Past Surgical History, Family History, and Social History were reviewed in [employer] record.  ROS  The following are not active complaints unless bolded sore throat, dysphagia, dental problems, itching, sneezing,  nasal congestion or excess/ purulent secretions, ear ache,   fever, chills, sweats, unintended wt loss, pleuritic or exertional cp, hemoptysis,  orthopnea pnd or leg swelling, presyncope, palpitations, heartburn, abdominal pain, anorexia, nausea, vomiting, diarrhea  or change in bowel or urinary habits, change in stools or urine, dysuria,hematuria,  rash, arthralgias,  visual complaints, headache, numbness weakness or ataxia or problems with walking or coordination,  change in mood/affect or memory.               Objective:   Physical Exam  12/06/2012        236  > 05/19/2013  223 > 06/01/2013  224  Wt Readings from Last 3 Encounters:  10/25/12 236 lb (107.049 kg)  03/03/12 227 lb (102.967 kg)    amb hoarse wf nad with classic voice fatigue and hoarseness  HEENT: nl dentition, turbinates, and orophanx. Nl external ear canals without cough reflex   NECK :  without JVD/Nodes/TM/ nl carotid upstrokes bilaterally   LUNGS: no acc muscle use, clear to A and P bilaterally with cough on  exp maneuvers   CV:  RRR  no s3 or murmur or increase in P2, no edema   ABD:  soft and nontender with nl excursion in the supine position. No bruits or organomegaly, bowel sounds nl  MS:  warm without deformities, calf tenderness, cyanosis or clubbing  SKIN: warm and dry without lesions       05/05/13 No active cardiopulmonary disease.         Assessment & Plan:

## 2013-06-01 NOTE — Assessment & Plan Note (Signed)
Adequate control on present rx, reviewed > no change in rx needed  (stay off acei indefinitely given inherent confusion in interpreting symptoms)

## 2013-06-01 NOTE — Patient Instructions (Addendum)
dulera 100 Take 2 puffs first thing in am and then another 2 puffs about 12 hours later - if all better in two weeks fill the precription  Prednisone 10 mg take  4 each am x 2 days,   2 each am x 2 days,  1 each am x 2 days and stop  Continue the pepcid 20 mg after bfast and at bedtime   Continue to suppress the cough with tramadol and continue the neurontin three times daily until cough completely gone without the need for tramadol  Please schedule a follow up office visit in 6 weeks, call sooner if needed

## 2013-06-01 NOTE — Assessment & Plan Note (Signed)
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 Improved effectiveness after extensive coaching during this visit to a level of approximately   75% so try dulera 100 Take 2 puffs first thing in am and then another 2 puffs about 12 hours later.   At same time worth remembering : Chronic cough is often simultaneously caused by more than one condition. A single cause has been found from 38 to 82% of the time, multiple causes from 18 to 62%. Multiply caused cough has been the result of three diseases up to 42% of the time.  So should continue rx for UACS until confirm response to rx for ? Cough variant asthma

## 2013-06-02 ENCOUNTER — Telehealth: Payer: Self-pay | Admitting: Internal Medicine

## 2013-06-02 MED ORDER — GABAPENTIN 100 MG PO CAPS: 100.0000 mg | ORAL_CAPSULE | Freq: Three times a day (TID) | ORAL | Status: DC

## 2013-06-02 NOTE — Telephone Encounter (Signed)
I spoke with pt. She is needing updated RX for gabapentin. I advised will do so. Nothing further needed

## 2013-07-13 ENCOUNTER — Encounter (INDEPENDENT_AMBULATORY_CARE_PROVIDER_SITE_OTHER): Payer: Self-pay

## 2013-07-13 ENCOUNTER — Encounter: Payer: Self-pay | Admitting: Internal Medicine

## 2013-07-13 ENCOUNTER — Ambulatory Visit (INDEPENDENT_AMBULATORY_CARE_PROVIDER_SITE_OTHER): Payer: BC Managed Care – PPO | Admitting: Internal Medicine

## 2013-07-13 VITALS — BP 142/90 | HR 85 | Temp 97.6°F | Ht 64.0 in | Wt 229.0 lb

## 2013-07-13 DIAGNOSIS — R05 Cough: Secondary | ICD-10-CM

## 2013-07-13 MED ORDER — GABAPENTIN 300 MG PO CAPS: 300.0000 mg | ORAL_CAPSULE | Freq: Three times a day (TID) | ORAL | Status: AC

## 2013-07-13 NOTE — Progress Notes (Signed)
Subjective:    Patient ID: [name] [name], female    DOB: March 30, 1951 MRN: [id number]    Brief patient profile:  25 yowf never smoker good ex tolerance at baseline wt 160 with new onset doe x 2008 referred 10/25/2012 to pulmonary clinic by Dr Donette Larry  History of Present Illness  10/25/2012 1st pulmonary eval on ACEi cc gradual worse indolent onset sob x  2008 and in addition several bad spells of sob with dx of pna rx as outpt with short term inhalers albuterol which seemed to help some - assoc with hoarseness.  Wt gain attributed to freq prednisone rx multiple times for back pain does not help breathing.  Min assoc chronic dry cough  More day than night when does cough.  rec benicar 40/25  One half daily Stop quinapril GERD diet   12/06/2012 f/u ov/Mickie Badders re sob and cough Chief Complaint  Patient presents with  . Follow-up    Breathing is much improved since the last visit.  Cough has also improved, but still has occ non prod cough-worse during her work day.   took cruise, fine with walking, still occ urge to clear throat esp in am ? Worse at work also rec Try pepcid  20 mg at bedtime for about a month to see if it helps your am throat clearing. Continue benicar and let me know if insurance covers ok or not.  See [name] NP for substitutes or Dr Donette Larry but avoid Ace inhbitor as a class due to your airway issues   05/19/2013 f/u ov/Ashford Clouse re: recurrent cough now off acei  And pepcid which corrected 100% Chief Complaint  Patient presents with  . Follow-up    Pt c/o increased cough x 7 wks- non prod and worse in the evening and at night. Having trouble sleeping. She states that she has been through 2 rounds and abx and prednisone with some relief. She is coughing to the point of vomiting about twice per day.    worst around 4 pm whether at work or not then at hs and all night long, no better with saba or qvar Most the time dry Only sob when cough  Acute onset with burning in throat  Started HRT 3  weeks prior to OV   Coughs so hard urinates also rec  Increase neurontin to three times daily until cough is gone, then resume bedtime only First take delsym two tsp every 12 hours and supplement if needed with  tramadol 50 mg up to 2 every 4 hours to suppress the urge to cough at all or even clear your throat.   Pantoprazole (protonix) 40 mg   Take 30-60 min before first meal of the day and Pepcid 20 mg one bedtime until return to office - this is the best way to tell whether stomach acid is contributing to your problem.   GERD diet  Prednisone 10 mg take  4 each am x 2 days,   2 each am x 2 days,  1 each am x 2 days and stop      06/01/2013 f/u ov/Lisel Siegrist re: chronic cough Chief Complaint  Patient presents with  . Follow-up    Pt states cough is some improved, but not much. Cough is prod in the am with minimal clear sputum.   cough worse at 3 am and better while on prednisone better p saba  Imp was ? Cough var asthma rec dulera 100 Take 2 puffs first thing in am and then another  2 puffs about 12 hours later - if all better in two weeks fill the precription Prednisone 10 mg take  4 each am x 2 days,   2 each am x 2 days,  1 each am x 2 days and stop Continue the pepcid 20 mg after bfast and at bedtime  Continue to suppress the cough with tramadol and continue the neurontin three times daily until cough completely gone without the need for tramadol    07/13/2013 f/u ov/Evian Salguero re: chronic cough  Esp bad   since Sept 2014  Chief Complaint  Patient presents with  . Follow-up    Cough continues to improve. Pt states cough is about 90% better. No new co's today. Has not had to use albuterol since her last visit.    not using anything for cough, not limited by breathing, no noct events   No obvious daytime variabilty or assoc   cp or chest tightness, subjective wheeze overt sinus or hb symptoms. No unusual exp hx or h/o childhood pna/ asthma or premature birth to herknowledge.    Also denies  any obvious fluctuation of symptoms with weather or environmental changes or other aggravating or alleviating factors except as outlined above   Current Medications, Allergies, Past Medical History, Past Surgical History, Family History, and Social History were reviewed in [employer] record.  ROS  The following are not active complaints unless bolded sore throat, dysphagia, dental problems, itching, sneezing,  nasal congestion or excess/ purulent secretions, ear ache,   fever, chills, sweats, unintended wt loss, pleuritic or exertional cp, hemoptysis,  orthopnea pnd or leg swelling, presyncope, palpitations, heartburn, abdominal pain, anorexia, nausea, vomiting, diarrhea  or change in bowel or urinary habits, change in stools or urine, dysuria,hematuria,  rash, arthralgias, visual complaints, headache, numbness weakness or ataxia or problems with walking or coordination,  change in mood/affect or memory.               Objective:   Physical Exam  12/06/2012        236  > 05/19/2013  223 > 06/01/2013  224 > 07/13/2013  229  Wt Readings from Last 3 Encounters:  10/25/12 236 lb (107.049 kg)  03/03/12 227 lb (102.967 kg)    amb wf nad   HEENT: nl dentition, turbinates, and orophanx. Nl external ear canals without cough reflex   NECK :  without JVD/Nodes/TM/ nl carotid upstrokes bilaterally   LUNGS: no acc muscle use, clear to A and P bilaterally with no cough on  exp maneuvers   CV:  RRR  no s3 or murmur or increase in P2, no edema   ABD:  soft and nontender with nl excursion in the supine position. No bruits or organomegaly, bowel sounds nl  MS:  warm without deformities, calf tenderness, cyanosis or clubbing  SKIN: warm and dry without lesions       05/05/13 No active cardiopulmonary disease.         Assessment & Plan:

## 2013-07-13 NOTE — Patient Instructions (Addendum)
Increase Neurontin to  300 three times a day to see if helps both feet burning and your throat clearing    Please schedule a follow up visit in 3 months but call sooner if needed

## 2013-07-14 NOTE — Assessment & Plan Note (Addendum)
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 A single cause has been found from 38 to 82% of the time, multiple causes from 18 to 62%. Multiply caused cough has been the result of three diseases up to 42% of the time.   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

## 2013-11-25 ENCOUNTER — Other Ambulatory Visit: Payer: Self-pay | Admitting: Orthopaedic Surgery

## 2013-11-29 NOTE — Pre-Procedure Instructions (Signed)
Valentina ShaggyMary D Bartolomei  11/29/2013   Your procedure is scheduled on:  Thursday Dec 01, 2013 at 12:30 PM..  [facility]"  Admitting at 10:30 AM.    Remember:   Do not eat food or drink liquids after midnight.   Take these medicines the morning of surgery with A SIP OF WATER: Escitalopram (Lexapro), Gabapentin (Neurontin), Metoprolol (Toprol XL), Dulera inhaler if needed   Do not wear jewelry, make-up or nail polish.  Do not shave 48 hours prior to surgery.   Do not bring valuables to the hospital.  [facility] is not responsible for any belongings or valuables.               Contacts, dentures or bridgework may not be worn into surgery.  Leave suitcase in the car. After surgery it may be brought to your room.  For patients admitted to the hospital, discharge time is determined by your treatment team.               Patients discharged the day of surgery will not be allowed to drive home.  Name and phone number of your driver: Family/Friend  Special Instructions: Shower the night before and the morning of your surgery using CHG soap   Please read over the following fact sheets that you were given: Pain Booklet, Coughing and Deep Breathing and Surgical Site Infection Prevention

## 2013-11-30 ENCOUNTER — Encounter (HOSPITAL_COMMUNITY): Payer: Self-pay

## 2013-11-30 ENCOUNTER — Encounter (HOSPITAL_COMMUNITY)
Admission: RE | Admit: 2013-11-30 | Discharge: 2013-11-30 | Disposition: A | Discharge status: Home / Self Care | Payer: BC Managed Care – PPO | Source: Ambulatory Visit | Attending: Orthopaedic Surgery | Admitting: Orthopaedic Surgery

## 2013-11-30 HISTORY — DX: Failed or difficult intubation, initial encounter: T88.4XXA

## 2013-11-30 HISTORY — DX: Gastro-esophageal reflux disease without esophagitis: K21.9

## 2013-11-30 HISTORY — DX: Pneumonia, unspecified organism: J18.9

## 2013-11-30 HISTORY — DX: Bronchitis, not specified as acute or chronic: J40

## 2013-11-30 LAB — CBC
HCT: 34.2 % — ABNORMAL LOW (ref 36.0–46.0)
Hemoglobin: 11.8 g/dL — ABNORMAL LOW (ref 12.0–15.0)
MCH: 32.7 pg (ref 26.0–34.0)
MCHC: 34.5 g/dL (ref 30.0–36.0)
MCV: 94.7 fL (ref 78.0–100.0)
PLATELETS: 198 10*3/uL (ref 150–400)
RBC: 3.61 MIL/uL — ABNORMAL LOW (ref 3.87–5.11)
RDW: 13.7 % (ref 11.5–15.5)
WBC: 7.3 10*3/uL (ref 4.0–10.5)

## 2013-11-30 LAB — BASIC METABOLIC PANEL
BUN: 12 mg/dL (ref 6–23)
CHLORIDE: 99 meq/L (ref 96–112)
CO2: 27 mEq/L (ref 19–32)
Calcium: 9.7 mg/dL (ref 8.4–10.5)
Creatinine, Ser: 0.57 mg/dL (ref 0.50–1.10)
GFR calc non Af Amer: >90 mL/min (ref >90)
Glucose, Bld: 131 mg/dL — ABNORMAL HIGH (ref 70–99)
POTASSIUM: 5.1 meq/L (ref 3.7–5.3)
SODIUM: 141 meq/L (ref 137–147)

## 2013-11-30 MED ORDER — CHLORHEXIDINE GLUCONATE 4 % EX LIQD
60.0000 mL | Freq: Once | CUTANEOUS | Status: DC
  Filled 2013-11-30: qty 60

## 2013-11-30 MED ORDER — CEFAZOLIN SODIUM-DEXTROSE 2-3 GM-% IV SOLR
2.0000 g | INTRAVENOUS | Status: AC
  Administered 2013-12-01: 2 g via INTRAVENOUS
  Filled 2013-11-30: qty 50

## 2013-11-30 NOTE — Progress Notes (Signed)
Anesthesia PAT Evaluation: Patient is a 63 year old female scheduled for right shoulder arthroscopy with rotator cuff repair on 12/01/13 by Dr. Jerl Santosalldorf.  Procedure was initially scheduled at [facility].  She underwent a block but after induction the anesthesiologist was unable to place the ETT X 2 using a Miller 3 blade.  Reportedly staff were unable to keep her adequately ventilated with bag mask, and she became apneic for 30 seconds, so case was aborted.  Her anesthesiologist Dr. Malvin JohnsPotter recommended that the case be moved to a hospital due to DIFFICULT AIRWAY related to short neck and promient tongue. She denies previously being told that she was a difficult intubation.  History includes non-smoker, DIFFICULT AIRWAY, HTN, DM2, anxiety, depression, asthma, OSA with nightly CPAP use, dyslipidemia, RLS, rosacea, tachycardia/SVT s/p EP mapping and ablation in [facility], cholecystectomy, tonsillectomy, hysteroscopy with D&C 03/04/12 (LMA used), two prior shoulder surgeries at the [facility]. BMI is 36.87 consistent with obesity. She has seen pulmonologist Dr. Sandrea HughsMichael Wert in the past from chronic cough which improved on Dulera and off ACEI. PCP is listed as Dr. Georgann HousekeeperKarrar Husain.  EKG on 11/30/13 showed NSR, moderate voltage criteria for LVH, non-specific ST/T wave abnormality.  Overall, I think her EKG is stable when compared to previous tracing on 03/03/12.  CXR on 05/05/13 showed: No active cardiopulmonary disease.  Preoperative labs noted.    On exam patient is a pleasant, obese female in NAD.  She does have a short neck.  Mouth opening is 3 FB, but only about 1 1/2 FB with her tongue extended.  I think her Mallampati is II. Neck mobility is okay. Heart RRR, no murmur noted.  Lungs clear.  No LE edema.    We discussed risk factors for difficult airway and bag-mask ventilation.  We discussed specialized equipment that can be used with intubation, with anticipated use of glidescope.   Although, I felt probably unlikely, we did discuss that sometimes an awake intubation is required in individuals with a difficult airway history.  I offered for her to speak with one of our anesthesiologists today, but she said that she was comfortable speaking with her assigned anesthesiologist on the day of surgery.  I requested the last three anesthesia records from the [facility], so these can be reviewed by her assigned anesthesiologist.  Because her surgery is tomorrow, I was concerned we may not receive the records in time.  She does have a copy of her most recent anesthesia record, so she will plan to bring them with her tomorrow just in case.  [name] Ochsllison Lailany Enoch, PA-C [facility]/Anesthesiology Phone [phone] 11/30/2013 11:08 AM  Addendum: 11/30/2013 3:40 PM Records received from [facility].  I received anesthesia records from:  - 09/19/10: Laryngoscopy: View 2. Stylet. Blade MAC4 used to place a 7.0 ETT.  Notes do indicate that at least at some point they were unable to visual the cords.  - 02/20/11: Laryngoscopy: View 1. Stylet. Blade MAC3 used to place a 7.0 ETT.   - 4/30/15Hyacinth Meeker: Miller 3 was used but could not get through the cords. Received a large amount of meds to get asleep and paralyzed. Initial mask was easy, but after missing intubation, tried glidescope and could not see anything.  Mask ventilation became very difficult-oral airway-two nasal trumpets-3 people holding mask, bagging and jaw thrust.  Surgery canceled due to inability to intubate patient.  When I spoke with her this morning, I did not realize  a glidescope had been tried as well at the surgical center. It may be that she ultimately needs an awake intubation; however she did have two previous successful intubations using MAC blades. As above, her assigned anesthesiologist will meet with her tomorrow and discuss the definitive anesthesia plan.

## 2013-11-30 NOTE — H&P (Signed)
[name] [name] is an 63 y.o. female.   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 This required 2 attempts at repair but eventually did very well.  That was a Engineer, production.  At this point she's had about a year of right shoulder pain which is not a Engineer, production.  She has trouble reaching up and out.  She has fallen several times.  She has had injections which have helped transiently.  She's been evaluated by ultrasound and MRI scan by Dr. Rip Harbour and is here to discuss surgical intervention.  She is the nice lady who brought [place] a banana pudding in the operating room.  Her pain is intermittent and severe.  She cannot sleep at night.  She has trouble carrying things.  The problem is getting worse.     MRI:  I reviewed an MRI scan films and report of a study done at the [facility] on 10/16/13.  This shows a supraspinatus rotator cuff tear which is mildly retracted.  The tissues appear good.  Past Medical History  Diagnosis Date  . Hypertension   . Diabetes mellitus   . Anxiety   . Depression   . Asthma     no inhaler use  . OSA (obstructive sleep apnea)     uses a CPAP  . Dyslipidemia   . Depression   . Anxiety   . RLS (restless legs syndrome)   . Rosacea   . Pneumonia   . Bronchitis   . GERD (gastroesophageal reflux disease)   . Dysrhythmia 2008    tachycardia/SVT  . Difficult intubation     previous intubations in 2012 with 7.0ETT using MAC 3 or 4; on 11/24/13 unable to intubate using Miller 3 followed by glidescope attempt at [place]    Past Surgical History  Procedure Laterality Date  . Cardiac electrophysiology mapping and ablation      at [facility] ~ 2008  . Dilation and curettage of uterus    . Shoulder arthroscopy Left 2012     x 2  . Hysteroscopy w/d&c  03/04/2012    Procedure: DILATATION AND CURETTAGE /HYSTEROSCOPY;  Surgeon: Maeola Sarah. Landry Mellow,  MD;  Location: [facility];  Service: Gynecology;  Laterality: N/A;  . Cesarean section    . Carpal tunnel release Bilateral   . Tonsillectomy    . Cholecystectomy      Family History  Problem Relation Age of Onset  . Breast cancer Mother   . Emphysema Mother     was a smoker   Social History:  reports that she has never smoked. She has never used smokeless tobacco. She reports that she drinks about 3.5 ounces of alcohol per week. She reports that she does not use illicit drugs.  Allergies:  Allergies  Allergen Reactions  . Prevacid [Lansoprazole] Hives    No prescriptions prior to admission    Results for orders placed during the hospital encounter of 11/30/13 (from the past 48 hour(s))  CBC     Status: Abnormal   Collection Time    11/30/13  9:52 AM      Result Value Ref Range   WBC 7.3  4.0 - 10.5 K/uL   RBC 3.61 (*) 3.87 - 5.11 MIL/uL   Hemoglobin 11.8 (*) 12.0 - 15.0 g/dL   HCT 34.2 (*) 36.0 - 46.0 %   MCV 94.7  78.0 -  100.0 fL   MCH 32.7  26.0 - 34.0 pg   MCHC 34.5  30.0 - 36.0 g/dL   RDW 13.7  11.5 - 15.5 %   Platelets 198  150 - 400 K/uL  BASIC METABOLIC PANEL     Status: Abnormal   Collection Time    11/30/13  9:52 AM      Result Value Ref Range   Sodium 141  137 - 147 mEq/L   Potassium 5.1  3.7 - 5.3 mEq/L   Chloride 99  96 - 112 mEq/L   CO2 27  19 - 32 mEq/L   Glucose, Bld 131 (*) 70 - 99 mg/dL   BUN 12  6 - 23 mg/dL   Creatinine, Ser 0.57  0.50 - 1.10 mg/dL   Calcium 9.7  8.4 - 10.5 mg/dL   GFR calc non Af Amer >90  >90 mL/min   GFR calc Af Amer >90  >90 mL/min   Comment: (NOTE)     The eGFR has been calculated using the CKD EPI equation.     This calculation has not been validated in all clinical situations.     eGFR's persistently <90 mL/min signify possible Chronic Kidney     Disease.   No results found.  Review of Systems  Musculoskeletal: Positive for joint pain.       Right shoulder   All other systems reviewed and are negative.   Last  menstrual period 03/03/2012. Physical Exam  Constitutional: She is oriented to person, place, and time. She appears well-developed and well-nourished.  HENT:  Head: Normocephalic and atraumatic.  Eyes: Conjunctivae and EOM are normal. Pupils are equal, round, and reactive to light.  Neck: Normal range of motion.  Cardiovascular: Normal rate and normal heart sounds.   Respiratory: Effort normal.  GI: Soft.  Musculoskeletal:  Right shoulder motion is full to 150 of forward flexion with external rotation to about 60 and internal rotation to her back pocket.  She has a painful arc of motion as she brings it up and down.  Her cuff strength is good in external rotation though weak in forward flexion.  She has no pain at the [employer] joint.  Cervical motion is full there is no palpable lymphadenopathy.  Sensation and motor function are intact in her hands with palpable pulses on both sides.   Neurological: She is alert and oriented to person, place, and time.  Skin: Skin is warm and dry.  Psychiatric: She has a normal mood and affect. Her behavior is normal. Judgment and thought content normal.     Assessment/Plan Right shoulder impingement and rotator cuff tear by MRI  Unfortunately [name] [name] needs an operation on her right shoulder similar to the ones she had on the left shoulder.  She needs an acromioplasty and rotator cuff repair.  We reviewed risk of anesthesia and infection related to this intervention.  I also reviewed a month in a sling followed by extensive therapy with which she is familiar.    Larwance Sachs Amariah Kierstead 11/30/2013, 6:02 PM

## 2013-12-01 ENCOUNTER — Encounter (HOSPITAL_COMMUNITY)
Admission: RE | Disposition: A | Discharge status: Home / Self Care | Payer: Self-pay | Source: Ambulatory Visit | Attending: Orthopaedic Surgery

## 2013-12-01 ENCOUNTER — Encounter (HOSPITAL_COMMUNITY): Payer: Self-pay | Admitting: Anesthesiology

## 2013-12-01 ENCOUNTER — Ambulatory Visit (HOSPITAL_COMMUNITY): Payer: BC Managed Care – PPO | Admitting: Anesthesiology

## 2013-12-01 ENCOUNTER — Encounter (HOSPITAL_COMMUNITY): Payer: BC Managed Care – PPO | Admitting: Vascular Surgery

## 2013-12-01 ENCOUNTER — Ambulatory Visit (HOSPITAL_COMMUNITY)
Admission: RE | Admit: 2013-12-01 | Discharge: 2013-12-01 | Disposition: A | Discharge status: Home / Self Care | Payer: BC Managed Care – PPO | Source: Ambulatory Visit | Attending: Orthopaedic Surgery | Admitting: Orthopaedic Surgery

## 2013-12-01 DIAGNOSIS — Z01812 Encounter for preprocedural laboratory examination: Secondary | ICD-10-CM | POA: Insufficient documentation

## 2013-12-01 DIAGNOSIS — G2581 Restless legs syndrome: Secondary | ICD-10-CM | POA: Insufficient documentation

## 2013-12-01 DIAGNOSIS — E785 Hyperlipidemia, unspecified: Secondary | ICD-10-CM | POA: Insufficient documentation

## 2013-12-01 DIAGNOSIS — K219 Gastro-esophageal reflux disease without esophagitis: Secondary | ICD-10-CM | POA: Insufficient documentation

## 2013-12-01 DIAGNOSIS — E119 Type 2 diabetes mellitus without complications: Secondary | ICD-10-CM | POA: Insufficient documentation

## 2013-12-01 DIAGNOSIS — M25819 Other specified joint disorders, unspecified shoulder: Secondary | ICD-10-CM | POA: Insufficient documentation

## 2013-12-01 DIAGNOSIS — M75120 Complete rotator cuff tear or rupture of unspecified shoulder, not specified as traumatic: Secondary | ICD-10-CM | POA: Insufficient documentation

## 2013-12-01 DIAGNOSIS — F3289 Other specified depressive episodes: Secondary | ICD-10-CM | POA: Insufficient documentation

## 2013-12-01 DIAGNOSIS — M7580 Other shoulder lesions, unspecified shoulder: Principal | ICD-10-CM

## 2013-12-01 DIAGNOSIS — I10 Essential (primary) hypertension: Secondary | ICD-10-CM | POA: Insufficient documentation

## 2013-12-01 DIAGNOSIS — F411 Generalized anxiety disorder: Secondary | ICD-10-CM | POA: Insufficient documentation

## 2013-12-01 DIAGNOSIS — G4733 Obstructive sleep apnea (adult) (pediatric): Secondary | ICD-10-CM | POA: Insufficient documentation

## 2013-12-01 DIAGNOSIS — F329 Major depressive disorder, single episode, unspecified: Secondary | ICD-10-CM | POA: Insufficient documentation

## 2013-12-01 DIAGNOSIS — Z01810 Encounter for preprocedural cardiovascular examination: Secondary | ICD-10-CM | POA: Insufficient documentation

## 2013-12-01 DIAGNOSIS — J45909 Unspecified asthma, uncomplicated: Secondary | ICD-10-CM | POA: Insufficient documentation

## 2013-12-01 DIAGNOSIS — M75101 Unspecified rotator cuff tear or rupture of right shoulder, not specified as traumatic: Secondary | ICD-10-CM

## 2013-12-01 HISTORY — PX: SHOULDER ARTHROSCOPY WITH ROTATOR CUFF REPAIR: SHX5685

## 2013-12-01 LAB — GLUCOSE, CAPILLARY
GLUCOSE-CAPILLARY: 139 mg/dL — AB (ref 70–99)
Glucose-Capillary: 123 mg/dL — ABNORMAL HIGH (ref 70–99)

## 2013-12-01 SURGERY — ARTHROSCOPY, SHOULDER, WITH ROTATOR CUFF REPAIR
Anesthesia: Regional | Site: Shoulder | Laterality: Right

## 2013-12-01 MED ORDER — BUPIVACAINE-EPINEPHRINE (PF) 0.5% -1:200000 IJ SOLN
INTRAMUSCULAR | Status: DC | PRN
  Administered 2013-12-01: 30 mL

## 2013-12-01 MED ORDER — ARTIFICIAL TEARS OP OINT
TOPICAL_OINTMENT | OPHTHALMIC | Status: AC
  Filled 2013-12-01: qty 3.5

## 2013-12-01 MED ORDER — PROPOFOL 10 MG/ML IV BOLUS
INTRAVENOUS | Status: DC | PRN
  Administered 2013-12-01: 200 mg via INTRAVENOUS
  Administered 2013-12-01: 20 mg via INTRAVENOUS

## 2013-12-01 MED ORDER — PHENYLEPHRINE HCL 10 MG/ML IJ SOLN
10.0000 mg | INTRAVENOUS | Status: DC | PRN
  Administered 2013-12-01: 15 ug/min via INTRAVENOUS

## 2013-12-01 MED ORDER — LACTATED RINGERS IV SOLN
INTRAVENOUS | Status: DC
  Administered 2013-12-01: 12:00:00 via INTRAVENOUS

## 2013-12-01 MED ORDER — FENTANYL CITRATE 0.05 MG/ML IJ SOLN
INTRAMUSCULAR | Status: AC
  Filled 2013-12-01: qty 5

## 2013-12-01 MED ORDER — SUCCINYLCHOLINE CHLORIDE 20 MG/ML IJ SOLN
INTRAMUSCULAR | Status: DC | PRN
  Administered 2013-12-01: 140 mg via INTRAVENOUS

## 2013-12-01 MED ORDER — ONDANSETRON HCL 4 MG/2ML IJ SOLN
INTRAMUSCULAR | Status: DC | PRN
Start: 2013-12-01 — End: 2013-12-01
  Administered 2013-12-01: 4 mg via INTRAVENOUS

## 2013-12-01 MED ORDER — FENTANYL CITRATE 0.05 MG/ML IJ SOLN
INTRAMUSCULAR | Status: DC | PRN
  Administered 2013-12-01: 150 ug via INTRAVENOUS
  Administered 2013-12-01: 50 ug via INTRAVENOUS

## 2013-12-01 MED ORDER — MIDAZOLAM HCL 2 MG/2ML IJ SOLN
INTRAMUSCULAR | Status: AC
  Administered 2013-12-01: 2 mg
  Filled 2013-12-01: qty 2

## 2013-12-01 MED ORDER — MIDAZOLAM HCL 5 MG/ML IJ SOLN
2.0000 mg | Freq: Once | INTRAMUSCULAR | Status: AC
  Administered 2013-12-01: 2 mg via INTRAVENOUS

## 2013-12-01 MED ORDER — GLYCOPYRROLATE 0.2 MG/ML IJ SOLN
INTRAMUSCULAR | Status: AC
  Filled 2013-12-01: qty 2

## 2013-12-01 MED ORDER — MIDAZOLAM HCL 2 MG/2ML IJ SOLN
INTRAMUSCULAR | Status: AC
  Filled 2013-12-01: qty 2

## 2013-12-01 MED ORDER — LIDOCAINE HCL (CARDIAC) 20 MG/ML IV SOLN
INTRAVENOUS | Status: DC | PRN
  Administered 2013-12-01: 40 mg via INTRAVENOUS

## 2013-12-01 MED ORDER — LACTATED RINGERS IV SOLN
INTRAVENOUS | Status: DC | PRN
  Administered 2013-12-01 (×2): via INTRAVENOUS

## 2013-12-01 MED ORDER — NEOSTIGMINE METHYLSULFATE 10 MG/10ML IV SOLN
INTRAVENOUS | Status: AC
  Filled 2013-12-01: qty 1

## 2013-12-01 MED ORDER — SODIUM CHLORIDE 0.9 % IR SOLN
Status: DC | PRN
  Administered 2013-12-01: 6000 mL

## 2013-12-01 MED ORDER — PROPOFOL 10 MG/ML IV BOLUS
INTRAVENOUS | Status: AC
  Filled 2013-12-01: qty 20

## 2013-12-01 MED ORDER — ONDANSETRON HCL 4 MG/2ML IJ SOLN
INTRAMUSCULAR | Status: AC
  Filled 2013-12-01: qty 2

## 2013-12-01 MED ORDER — ROCURONIUM BROMIDE 50 MG/5ML IV SOLN
INTRAVENOUS | Status: AC
  Filled 2013-12-01: qty 1

## 2013-12-01 MED ORDER — OXYCODONE-ACETAMINOPHEN 5-325 MG PO TABS: 1.0000 | ORAL_TABLET | Freq: Four times a day (QID) | ORAL | Status: DC | PRN

## 2013-12-01 MED ORDER — ARTIFICIAL TEARS OP OINT
TOPICAL_OINTMENT | OPHTHALMIC | Status: DC | PRN
  Administered 2013-12-01: 1 via OPHTHALMIC

## 2013-12-01 MED ORDER — FENTANYL CITRATE 0.05 MG/ML IJ SOLN
INTRAMUSCULAR | Status: AC
  Filled 2013-12-01: qty 2

## 2013-12-01 MED ORDER — FENTANYL CITRATE 0.05 MG/ML IJ SOLN
100.0000 ug | Freq: Once | INTRAMUSCULAR | Status: AC
Start: 2013-12-01 — End: 2013-12-01
  Administered 2013-12-01: 100 ug via INTRAVENOUS

## 2013-12-01 SURGICAL SUPPLY — 76 items
ADH SKN CLS APL DERMABOND .7 (GAUZE/BANDAGES/DRESSINGS)
ANCH SUT 4.5 SUBCORTICAL WNG (Anchor) ×1 IMPLANT
ANCHOR SUT POPLOK 4.5 (Anchor) ×1 IMPLANT
ANCHOR SUT POPLOK 4.5MM (Anchor) ×1 IMPLANT
APL SKNCLS STERI-STRIP NONHPOA (GAUZE/BANDAGES/DRESSINGS)
BENZOIN TINCTURE PRP APPL 2/3 (GAUZE/BANDAGES/DRESSINGS) IMPLANT
BLADE 10 SAFETY STRL DISP (BLADE) ×3 IMPLANT
BLADE CUDA 5.5 (BLADE) IMPLANT
BLADE GREAT WHITE 4.2 (BLADE) ×2 IMPLANT
BLADE GREAT WHITE 4.2MM (BLADE) ×1
BLADE SURG 15 STRL LF DISP TIS (BLADE) IMPLANT
BLADE SURG 15 STRL SS (BLADE)
BUR VERTEX HOODED 4.5 (BURR) IMPLANT
CANISTER OMNI JUG 16 LITER (MISCELLANEOUS) ×3 IMPLANT
CANISTER SUCTION 2500CC (MISCELLANEOUS) IMPLANT
CANNULA SHOULDER 7CM (CANNULA) ×3 IMPLANT
CANNULA TWIST IN 8.25X7CM (CANNULA) IMPLANT
CLOSURE WOUND 1/2 X4 (GAUZE/BANDAGES/DRESSINGS)
DECANTER SPIKE VIAL GLASS SM (MISCELLANEOUS) IMPLANT
DERMABOND ADVANCED (GAUZE/BANDAGES/DRESSINGS)
DERMABOND ADVANCED .7 DNX12 (GAUZE/BANDAGES/DRESSINGS) IMPLANT
DRAPE PROXIMA HALF (DRAPES) ×3 IMPLANT
DRAPE STERI 35X30 U-POUCH (DRAPES) ×3 IMPLANT
DRAPE U-SHAPE 47X51 STRL (DRAPES) ×3 IMPLANT
DRSG EMULSION OIL 3X3 NADH (GAUZE/BANDAGES/DRESSINGS) ×3 IMPLANT
DRSG PAD ABDOMINAL 8X10 ST (GAUZE/BANDAGES/DRESSINGS) ×3 IMPLANT
DURAPREP 26ML APPLICATOR (WOUND CARE) ×3 IMPLANT
ELECT MENISCUS 165MM 90D (ELECTRODE) IMPLANT
ELECT REM PT RETURN 9FT ADLT (ELECTROSURGICAL) ×3
ELECTRODE REM PT RTRN 9FT ADLT (ELECTROSURGICAL) ×1 IMPLANT
GLOVE BIO SURGEON STRL SZ8.5 (GLOVE) ×3 IMPLANT
GLOVE BIOGEL PI IND STRL 8 (GLOVE) ×1 IMPLANT
GLOVE BIOGEL PI IND STRL 8.5 (GLOVE) ×1 IMPLANT
GLOVE BIOGEL PI INDICATOR 8 (GLOVE) ×2
GLOVE BIOGEL PI INDICATOR 8.5 (GLOVE) ×2
GLOVE SS BIOGEL STRL SZ 8 (GLOVE) ×2 IMPLANT
GLOVE SUPERSENSE BIOGEL SZ 8 (GLOVE) ×4
GOWN STRL REUS W/ TWL XL LVL3 (GOWN DISPOSABLE) ×1 IMPLANT
GOWN STRL REUS W/TWL 2XL LVL3 (GOWN DISPOSABLE) ×3 IMPLANT
GOWN STRL REUS W/TWL XL LVL3 (GOWN DISPOSABLE) ×3
KIT BASIN OR (CUSTOM PROCEDURE TRAY) ×3 IMPLANT
MANIFOLD NEPTUNE II (INSTRUMENTS) IMPLANT
NDL SCORPION MULTI FIRE (NEEDLE) IMPLANT
NDL SUT 6 .5 CRC .975X.05 MAYO (NEEDLE) IMPLANT
NEEDLE MAYO TAPER (NEEDLE)
NEEDLE SCORPION MULTI FIRE (NEEDLE) IMPLANT
NS IRRIG 1000ML POUR BTL (IV SOLUTION) IMPLANT
PACK ARTHROSCOPY DSU (CUSTOM PROCEDURE TRAY) ×3 IMPLANT
PASSER SUT SWANSON 36MM LOOP (INSTRUMENTS) IMPLANT
PENCIL BUTTON HOLSTER BLD 10FT (ELECTRODE) IMPLANT
SET ARTHROSCOPY TUBING (MISCELLANEOUS) ×3
SET ARTHROSCOPY TUBING LN (MISCELLANEOUS) ×1 IMPLANT
SLING ARM FOAM STRAP SML (SOFTGOODS) IMPLANT
SLING ARM LRG ADULT FOAM STRAP (SOFTGOODS) IMPLANT
SLING ARM MED ADULT FOAM STRAP (SOFTGOODS) IMPLANT
SLING ARM XL FOAM STRAP (SOFTGOODS) IMPLANT
SPONGE GAUZE 4X4 12PLY (GAUZE/BANDAGES/DRESSINGS) ×3 IMPLANT
SPONGE GAUZE 4X4 12PLY STER LF (GAUZE/BANDAGES/DRESSINGS) ×2 IMPLANT
SPONGE LAP 4X18 X RAY DECT (DISPOSABLE) IMPLANT
STRIP CLOSURE SKIN 1/2X4 (GAUZE/BANDAGES/DRESSINGS) IMPLANT
SUCTION FRAZIER TIP 10 FR DISP (SUCTIONS) IMPLANT
SUT ETHIBOND 2 OS 4 DA (SUTURE) IMPLANT
SUT ETHILON 3 0 PS 1 (SUTURE) ×3 IMPLANT
SUT FIBERWIRE #2 38 T-5 BLUE (SUTURE)
SUT PDS AB 2-0 CT1 27 (SUTURE) IMPLANT
SUT VIC AB 2-0 SH 27 (SUTURE)
SUT VIC AB 2-0 SH 27XBRD (SUTURE) IMPLANT
SUT VICRYL 4-0 PS2 18IN ABS (SUTURE) IMPLANT
SUTURE FIBERWR #2 38 T-5 BLUE (SUTURE) IMPLANT
SYR BULB 3OZ (MISCELLANEOUS) IMPLANT
TAPE CLOTH SURG 6X10 WHT LF (GAUZE/BANDAGES/DRESSINGS) ×2 IMPLANT
TOWEL OR 17X24 6PK STRL BLUE (TOWEL DISPOSABLE) ×3 IMPLANT
TOWEL OR NON WOVEN STRL DISP B (DISPOSABLE) ×3 IMPLANT
WAND STAR VAC 90 (SURGICAL WAND) ×3 IMPLANT
WATER STERILE IRR 1000ML POUR (IV SOLUTION) ×3 IMPLANT
YANKAUER SUCT BULB TIP NO VENT (SUCTIONS) IMPLANT

## 2013-12-01 NOTE — Op Note (Signed)
#  [id number] 

## 2013-12-01 NOTE — Anesthesia Postprocedure Evaluation (Signed)
  Anesthesia Post-op Note  Patient: [name] [name]  Procedure(s) Performed: Procedure(s): RIGHT SHOULDER ARTHROSCOPY WITH ROTATOR CUFF REPAIR (Right)  Patient Location: PACU  Anesthesia Type:GA combined with regional for post-op pain  Level of Consciousness: awake, alert , oriented and patient cooperative  Airway and Oxygen Therapy: Patient Spontanous Breathing and Patient connected to nasal cannula oxygen  Post-op Pain: none  Post-op Assessment: Post-op Vital signs reviewed, Patient's Cardiovascular Status Stable, Respiratory Function Stable, Patent Airway, No signs of Nausea or vomiting and Pain level controlled  Post-op Vital Signs: Reviewed and stable  Last Vitals:  Filed Vitals:   12/01/13 1400  BP: 136/77  Pulse:   Temp: 36.7 C  Resp:     Complications: No apparent anesthesia complications

## 2013-12-01 NOTE — Anesthesia Preprocedure Evaluation (Addendum)
Anesthesia Evaluation  Patient identified by MRN, date of birth, ID band Patient awake    Reviewed: Allergy & Precautions, H&P , NPO status , Patient's Chart, lab work & pertinent test results, reviewed documented beta blocker date and time   History of Anesthesia Complications (+) DIFFICULT AIRWAY and history of anesthetic complications (difficult intubation x2)  Airway Mallampati: II TM Distance: >3 FB Neck ROM: Full    Dental  (+) Teeth Intact, Dental Advisory Given, Caps   Pulmonary sleep apnea and Continuous Positive Airway Pressure Ventilation ,  breath sounds clear to auscultation  Pulmonary exam normal       Cardiovascular hypertension, Pt. on medications and Pt. on home beta blockers - angina+ dysrhythmias Rhythm:Regular Rate:Normal     Neuro/Psych PSYCHIATRIC DISORDERS Anxiety Depression negative neurological ROS     GI/Hepatic Neg liver ROS, GERD-  Medicated and Controlled,  Endo/Other  diabetes (glu 123), Well Controlled, Type 2, Oral Hypoglycemic AgentsMorbid obesity  Renal/GU negative Renal ROS     Musculoskeletal   Abdominal (+) + obese,   Peds  Hematology   Anesthesia Other Findings   Reproductive/Obstetrics                          Anesthesia Physical Anesthesia Plan  ASA: III  Anesthesia Plan: General and Regional   Post-op Pain Management: MAC Combined w/ Regional for Post-op pain   Induction: Intravenous  Airway Management Planned: Oral ETT and Video Laryngoscope Planned  Additional Equipment:   Intra-op Plan:   Post-operative Plan: Extubation in OR  Informed Consent: I have reviewed the patients History and Physical, chart, labs and discussed the procedure including the risks, benefits and alternatives for the proposed anesthesia with the patient or authorized representative who has indicated his/her understanding and acceptance.   Dental advisory given  Plan  Discussed with: CRNA, Anesthesiologist and Surgeon  Anesthesia Plan Comments: (Plan routine monitors, GETA with VideoGlide intubation)       Anesthesia Quick Evaluation

## 2013-12-01 NOTE — Anesthesia Procedure Notes (Addendum)
Procedure Name: Intubation Date/Time: 12/01/2013 12:53 PM Performed by: KEY, Nuala AlphaKRISTOPHER Pre-anesthesia Checklist: Patient identified, Emergency Drugs available, Suction available, Patient being monitored and Timeout performed Patient Re-evaluated:Patient Re-evaluated prior to inductionOxygen Delivery Method: Circle system utilized Preoxygenation: Pre-oxygenation with 100% oxygen Intubation Type: IV induction Ventilation: Mask ventilation with difficulty Grade View: Grade III Tube type: Oral Tube size: 7.0 mm Number of attempts: 2 Airway Equipment and Method: Video-laryngoscopy and Rigid stylet Placement Confirmation: ETT inserted through vocal cords under direct vision,  positive ETCO2,  CO2 detector and breath sounds checked- equal and bilateral Secured at: 22 cm Tube secured with: Tape Dental Injury: Teeth and Oropharynx as per pre-operative assessment  Difficulty Due To: Difficulty was anticipated Future Recommendations: Recommend- induction with short-acting agent, and alternative techniques readily available Comments: Small mouth opening, thick non compressible tongue and chin.   Anesthesia Regional Block:  Interscalene brachial plexus block  Pre-Anesthetic Checklist: ,, timeout performed, Correct Patient, Correct Site, Correct Laterality, Correct Procedure, Correct Position, site marked, Risks and benefits discussed,  Surgical consent,  Pre-op evaluation,  At surgeon's request and post-op pain management  Laterality: Right and Upper  Prep: chloraprep       Needles:  Injection technique: Single-shot  Needle Type: Echogenic Stimulator Needle      Needle Gauge: 22 and 22 G    Additional Needles:  Procedures: nerve stimulator Interscalene brachial plexus block  Nerve Stimulator or Paresthesia:  Response: forearm twitch, 0.45 mA, 0.1 ms,   Additional Responses:   Narrative:  Start time: 12/01/2013 11:50 AM End time: 12/01/2013 11:56 AM Injection made incrementally with  aspirations every 5 mL.  Performed by: Personally  Anesthesiologist: Sandford Craze Maaz Spiering, MD  Additional Notes: Pt identified in Holding room.  Monitors applied. Working IV access confirmed. Sterile prep R neck.  #22ga PNS to forearm twitch at 0.6645mA threshold.  30cc 0.5% Bupivacaine with 1:200k epi injected incrementally after negative test dose.  Patient asymptomatic, VSS, no heme aspirated, tolerated well.  Sandford Craze Kahliya Fraleigh, MD

## 2013-12-01 NOTE — Transfer of Care (Signed)
Immediate Anesthesia Transfer of Care Note  Patient: [name] [name]  Procedure(s) Performed: Procedure(s): RIGHT SHOULDER ARTHROSCOPY WITH ROTATOR CUFF REPAIR (Right)  Patient Location: PACU  Anesthesia Type:GA combined with regional for post-op pain  Level of Consciousness: awake, alert  and oriented  Airway & Oxygen Therapy: Patient Spontanous Breathing and Patient connected to face mask oxygen  Post-op Assessment: Report given to PACU RN  Post vital signs: Reviewed and stable  Complications: No apparent anesthesia complications

## 2013-12-01 NOTE — Interval H&P Note (Signed)
History and Physical Interval Note:  12/01/2013 11:34 AM  [name] [name]  has presented today for surgery, with the diagnosis of right shoulder impingement and rotator cuff tear  The various methods of treatment have been discussed with the patient and family. After consideration of risks, benefits and other options for treatment, the patient has consented to  Procedure(s): RIGHT SHOULDER ARTHROSCOPY WITH ROTATOR CUFF REPAIR (Right) as a surgical intervention .  The patient's history has been reviewed, patient examined, no change in status, stable for surgery.  I have reviewed the patient's chart and labs.  Questions were answered to the patient's satisfaction.     Velna OchsPeter G Cassia Fein

## 2013-12-01 NOTE — Progress Notes (Signed)
Patient is having hard time coming off O2. Was given an IS and tolerated well. Patient reads 96% on 1L and 88-91% on RA. Dr. Noreene LarssonJoslin notified to evaluate patient. Dr. Noreene LarssonJoslin came to bedside stated patient can go home. Instructed to wear CPAP machine while resting and sleeping tonight and tomorrow. VSS otherwise, no pain. Wants to go home. Family called back to bedside for discharge.

## 2013-12-02 ENCOUNTER — Encounter (HOSPITAL_COMMUNITY): Payer: Self-pay | Admitting: Orthopaedic Surgery

## 2013-12-02 NOTE — Op Note (Signed)
NAME:  [name], [name]                   ACCOUNT NO.:  [id number]  MEDICAL RECORD NO.:  [id number]  LOCATION:  [facility]                         FACILITY:  [facility]  PHYSICIAN:  Lubertha Basqueeter G. Maricel Swartzendruber, M.D.DATE OF BIRTH:  03-31-1951  DATE OF PROCEDURE:  12/01/2013 DATE OF DISCHARGE:  12/01/2013                              OPERATIVE REPORT   PREOPERATIVE DIAGNOSES: 1. Right shoulder impingement. 2. Right shoulder rotator cuff tear.  POSTOPERATIVE DIAGNOSES: 1. Right shoulder impingement. 2. Right shoulder rotator cuff tear.  PROCEDURE: 1. Right shoulder arthroscopic acromioplasty. 2. Right shoulder arthroscopic debridement. 3. Right shoulder arthroscopic rotator cuff repair.  ANESTHESIA:  General and block.  ATTENDING SURGEON:  Lubertha Basqueeter G. Jerl Santosalldorf, MD  ASSISTANT:  Elodia FlorenceAndrew Nida, PA  INDICATION FOR PROCEDURE:  The patient is a 63 year old woman with a long history of right shoulder pain.  This has persisted despite oral anti-inflammatories and injectables.  She has a very painful time with her shoulder when she tries to reach up and out.  She has trouble resting at night.  She has failed conservative measures.  By MRI scan, she has a rotator cuff tear.  She is offered an arthroscopy.  Informed operative consent was obtained after discussion of possible complications including reaction to anesthesia and infection.  SUMMARY OF FINDINGS AND PROCEDURE:  Under general anesthesia and a block, a right shoulder arthroscopy was performed.  Glenohumeral joint showed no degenerative changes and the biceps tendon looked normal.  The subscapularis appeared to be intact.  She did have a mildly retracted supraspinatus tear, which was about a centimeter and a half in size and of good tissue.  We elected to repair.  I performed an acromioplasty and then repaired the rotator cuff with 2 inverted mattress sutures secured with 1 Linvatec anchor.  Elodia Florencendrew Nida assisted throughout and was invaluable to the  completion of the case in that he helped pass instruments to make this possible in an arthroscopic fashion.  The patient was discharged home the same day.  DESCRIPTION OF PROCEDURE:  The patient was taken to the operating suite where general anesthetic was applied without difficulty.  She was also given a block in the pre-anesthesia area.  She was positioned in a beach- chair position and prepped and draped in normal sterile fashion.  After the administration of IV Kefzol and an appropriate time-out, an arthroscopy of the right shoulder was performed through a total of 3 portals.  Findings were as noted above.  Procedure consisted of a debridement of some devitalized cuff tissue followed by an acromioplasty with a bur in the lateral position, followed by transfer of the bur to the posterior position.  We then created a bleeding bed of bone with a bur for cuff repair sacrificing a tiny amount of articular cartilage.  I passed 2 inverted mattress sutures of Hi-Fi suture through the rotator cuff, and utilized these to reapproximate the cuff to the greater tuberosity with one Linvatec PopLok anchor.  This seemed to give us a nice tight repair.  The shoulder was thoroughly irrigated followed by reapproximation of portals loosely with nylon.  Adaptic was applied followed by dry gauze and tape.  Estimated blood  loss and intraoperative fluids can be obtained from anesthesia records.  DISPOSITION:  The patient was extubated in the operating room and taken to recovery room in stable condition.  She was to go home the same-day and follow up in the office in less than a week.  I will contact her by phone tonight.     Lubertha BasquePeter G. Jerl Santosalldorf, M.D.     PGD/MEDQ  D:  12/01/2013  T:  12/02/2013  Job:  [id number]

## 2013-12-11 ENCOUNTER — Other Ambulatory Visit: Payer: Self-pay | Admitting: Internal Medicine

## 2014-02-07 ENCOUNTER — Other Ambulatory Visit: Payer: Self-pay | Admitting: Obstetrics and Gynecology

## 2014-02-07 ENCOUNTER — Other Ambulatory Visit (HOSPITAL_COMMUNITY)
Admission: RE | Admit: 2014-02-07 | Discharge: 2014-02-07 | Disposition: A | Discharge status: Home / Self Care | Payer: BC Managed Care – PPO | Source: Ambulatory Visit | Attending: Obstetrics and Gynecology | Admitting: Obstetrics and Gynecology

## 2014-02-07 DIAGNOSIS — Z01419 Encounter for gynecological examination (general) (routine) without abnormal findings: Secondary | ICD-10-CM | POA: Insufficient documentation

## 2014-02-08 LAB — CYTOLOGY - PAP

## 2014-04-06 ENCOUNTER — Other Ambulatory Visit: Payer: Self-pay | Admitting: Internal Medicine

## 2014-04-16 ENCOUNTER — Other Ambulatory Visit: Payer: Self-pay | Admitting: Internal Medicine

## 2014-05-23 ENCOUNTER — Other Ambulatory Visit (HOSPITAL_COMMUNITY): Payer: Self-pay | Admitting: Internal Medicine

## 2014-05-23 DIAGNOSIS — Z1231 Encounter for screening mammogram for malignant neoplasm of breast: Secondary | ICD-10-CM

## 2014-05-29 ENCOUNTER — Ambulatory Visit (HOSPITAL_COMMUNITY)
Admission: RE | Admit: 2014-05-29 | Discharge: 2014-05-29 | Disposition: A | Discharge status: Home / Self Care | Payer: BC Managed Care – PPO | Source: Ambulatory Visit | Attending: Internal Medicine | Admitting: Internal Medicine

## 2014-05-29 DIAGNOSIS — Z803 Family history of malignant neoplasm of breast: Secondary | ICD-10-CM | POA: Diagnosis not present

## 2014-05-29 DIAGNOSIS — Z1231 Encounter for screening mammogram for malignant neoplasm of breast: Secondary | ICD-10-CM

## 2014-08-04 ENCOUNTER — Encounter (INDEPENDENT_AMBULATORY_CARE_PROVIDER_SITE_OTHER): Payer: Self-pay

## 2014-08-04 ENCOUNTER — Ambulatory Visit (INDEPENDENT_AMBULATORY_CARE_PROVIDER_SITE_OTHER): Payer: BLUE CROSS/BLUE SHIELD | Admitting: Internal Medicine

## 2014-08-04 ENCOUNTER — Encounter: Payer: Self-pay | Admitting: Internal Medicine

## 2014-08-04 VITALS — BP 124/80 | HR 70 | Temp 98.4°F | Ht 64.0 in | Wt 215.0 lb

## 2014-08-04 DIAGNOSIS — R058 Other specified cough: Secondary | ICD-10-CM

## 2014-08-04 DIAGNOSIS — I10 Essential (primary) hypertension: Secondary | ICD-10-CM

## 2014-08-04 DIAGNOSIS — R05 Cough: Secondary | ICD-10-CM

## 2014-08-04 MED ORDER — TRAMADOL HCL 50 MG PO TABS: ORAL_TABLET | ORAL | Status: DC

## 2014-08-04 MED ORDER — AMOXICILLIN-POT CLAVULANATE 875-125 MG PO TABS: 1.0000 | ORAL_TABLET | Freq: Two times a day (BID) | ORAL | Status: DC

## 2014-08-04 MED ORDER — PREDNISONE 10 MG PO TABS: ORAL_TABLET | ORAL | Status: DC

## 2014-08-04 MED ORDER — PANTOPRAZOLE SODIUM 40 MG PO TBEC: 40.0000 mg | DELAYED_RELEASE_TABLET | Freq: Every day | ORAL | Status: DC

## 2014-08-04 NOTE — Progress Notes (Signed)
Subjective:    Patient ID: [name] [name], female    DOB: 10/26/1950 MRN: [id number]    Brief patient profile:  35 yowf never smoker good ex tolerance at baseline wt 160 with new onset doe x 2008 referred 10/25/2012 to pulmonary clinic by Dr Donette Larry  History of Present Illness  10/25/2012 1st pulmonary eval on ACEi cc gradual worse indolent onset sob x  2008 and in addition several bad spells of sob with dx of pna rx as outpt with short term inhalers albuterol which seemed to help some - assoc with hoarseness.  Wt gain attributed to freq prednisone rx multiple times for back pain does not help breathing.  Min assoc chronic dry cough  More day than night when does cough.  rec benicar 40/25  One half daily Stop quinapril GERD diet   12/06/2012 f/u ov/[name] [name] re sob and cough Chief Complaint  Patient presents with  . Follow-up    Breathing is much improved since the last visit.  Cough has also improved, but still has occ non prod cough-worse during her work day.   took cruise, fine with walking, still occ urge to clear throat esp in am ? Worse at work also rec Try pepcid  20 mg at bedtime for about a month to see if it helps your am throat clearing. Continue benicar and let me know if insurance covers ok or not.  See [name] NP for substitutes or Dr Donette Larry but avoid Ace inhbitor as a class due to your airway issues   05/19/2013 f/u ov/[name] [name] re: recurrent cough now off acei  And pepcid which corrected 100% Chief Complaint  Patient presents with  . Follow-up    Pt c/o increased cough x 7 wks- non prod and worse in the evening and at night. Having trouble sleeping. She states that she has been through 2 rounds and abx and prednisone with some relief. She is coughing to the point of vomiting about twice per day.    worst around 4 pm whether at work or not then at hs and all night long, no better with saba or qvar Most the time dry Only sob when cough  Acute onset with burning in throat  Started HRT 3  weeks prior to OV   Coughs so hard urinates also rec  Increase neurontin to three times daily until cough is gone, then resume bedtime only First take delsym two tsp every 12 hours and supplement if needed with  tramadol 50 mg up to 2 every 4 hours to suppress the urge to cough at all or even clear your throat.   Pantoprazole (protonix) 40 mg   Take 30-60 min before first meal of the day and Pepcid 20 mg one bedtime until return to office - this is the best way to tell whether stomach acid is contributing to your problem.   GERD diet  Prednisone 10 mg take  4 each am x 2 days,   2 each am x 2 days,  1 each am x 2 days and stop      06/01/2013 f/u ov/[name] [name] re: chronic cough Chief Complaint  Patient presents with  . Follow-up    Pt states cough is some improved, but not much. Cough is prod in the am with minimal clear sputum.   cough worse at 3 am and better while on prednisone better p saba  Imp was ? Cough var asthma rec dulera 100 Take 2 puffs first thing in am and then another  2 puffs about 12 hours later - if all better in two weeks fill the precription Prednisone 10 mg take  4 each am x 2 days,   2 each am x 2 days,  1 each am x 2 days and stop Continue the pepcid 20 mg after bfast and at bedtime  Continue to suppress the cough with tramadol and continue the neurontin three times daily until cough completely gone without the need for tramadol    07/13/2013 f/u ov/[name] [name] re: chronic cough  Esp bad   since Sept 2014  Chief Complaint  Patient presents with  . Follow-up    Cough continues to improve. Pt states cough is about 90% better. No new co's today. Has not had to use albuterol since her last visit.    not using anything for cough, not limited by breathing, no noct events  Rec Increase Neurontin to  300 three times a day to see if helps both feet burning and your throat clearing  > 100% improved chronically       08/04/2014 f/u ov/[name] [name] re:  Chief Complaint  Patient presents  with  . Acute Visit    Pt c/o cough since 07/21/14- prod with moderate yellow sputum. She also SOB, wheezing, chest tightness and fatigue. She was seen at [facility] on 07/28/14 and was prescribed zithromax with no help.  She is using her Dulera with not much relief.   maint on dulera 100  One bid and pepcid 20 hs  Acutely ill sore throat hacking cough became productive went to UC > zpak complete 1 week prior to OV  > still coughing up yellow mucus / worse when lie down takes her breath away and causes gen chest tightness     No obvious daytime variabilty or assoc   cp or  subjective wheeze overt sinus or hb symptoms. No unusual exp hx or h/o childhood pna/ asthma or premature birth to herknowledge.    Also denies any obvious fluctuation of symptoms with weather or environmental changes or other aggravating or alleviating factors except as outlined above   Current Medications, Allergies, Past Medical History, Past Surgical History, Family History, and Social History were reviewed in [employer] record.  ROS  The following are not active complaints unless bolded sore throat, dysphagia, dental problems, itching, sneezing,  nasal congestion or excess/ purulent secretions, ear ache,   fever, chills, sweats, unintended wt loss, pleuritic or exertional cp, hemoptysis,  orthopnea pnd or leg swelling, presyncope, palpitations, heartburn, abdominal pain, anorexia, nausea, vomiting, diarrhea  or change in bowel or urinary habits, change in stools or urine, dysuria,hematuria,  rash, arthralgias, visual complaints, headache, numbness weakness or ataxia or problems with walking or coordination,  change in mood/affect or memory.               Objective:   Physical Exam  12/06/2012        236  > 05/19/2013  223 > 06/01/2013  224 > 07/13/2013  229 > 08/04/2014 215  Wt Readings from Last 3 Encounters:  10/25/12 236 lb (107.049 kg)  03/03/12 227 lb (102.967 kg)    amb wf nad   HEENT: nl  dentition, turbinates, and orophanx. Nl external ear canals without cough reflex   NECK :  without JVD/Nodes/TM/ nl carotid upstrokes bilaterally   LUNGS: no acc muscle use, clear to A and P bilaterally with no cough on  exp maneuvers   CV:  RRR  no s3 or murmur or  increase in P2, no edema   ABD:  soft and nontender with nl excursion in the supine position. No bruits or organomegaly, bowel sounds nl  MS:  warm without deformities, calf tenderness, cyanosis or clubbing  SKIN: warm and dry without lesions              Assessment & Plan:

## 2014-08-04 NOTE — Patient Instructions (Addendum)
Augmentin 875 mg take one pill twice daily  X 10 days - take at breakfast and supper with large glass of water.  It would help reduce the usual side effects (diarrhea and yeast infections) if you ate cultured yogurt at lunch.   The key to effective treatment for your cough is eliminating the non-stop cycle of cough you're stuck in long enough to let your airway heal completely and then see if there is anything still making you cough once you stop the cough suppression, but this should take no more than 5 days to figure out  First take delsym two tsp every 12 hours and supplement if needed with  tramadol 50 mg up to 2 every 4 hours to suppress the urge to cough at all or even clear your throat. Swallowing water or using ice chips/non mint and menthol containing candies (such as lifesavers or sugarless jolly ranchers) are also effective.  You should rest your voice and avoid activities that you know make you cough.  Once you have eliminated the cough for 3 straight days try reducing the tramadol first,  then the delsym as tolerated.    Pantoprazole (protonix) 40 mg   Take 30-60 min before first meal of the day and Pepcid 20 mg one bedtime until return to office - this is the best way to tell whether stomach acid is contributing to your problem.   GERD (REFLUX)  is an extremely common cause of respiratory symptoms, many times with no significant heartburn at all.    It can be treated with medication, but also with lifestyle changes including avoidance of late meals, excessive alcohol, smoking cessation, and avoid fatty foods, chocolate, peppermint, colas, red wine, and acidic juices such as orange juice.  NO MINT OR MENTHOL PRODUCTS SO NO COUGH DROPS  USE SUGARLESS CANDY INSTEAD (jolley ranchers or Stover's)  NO OIL BASED VITAMINS - use powdered substitutes.    Prednisone 10 mg take  4 each am x 2 days,   2 each am x 2 days,  1 each am x 2 days and stop    Please schedule a follow up office visit in  2 weeks, sooner if needed  Add needs cxr if not better and maybe sinus ct  Needs to also consider change dulera to "prn" if does ok for months on neurontin

## 2014-08-05 ENCOUNTER — Encounter: Payer: Self-pay | Admitting: Internal Medicine

## 2014-08-05 NOTE — Assessment & Plan Note (Addendum)
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/19/2013 > some better off - added dulera 100 2bid 06/01/2013  - increase neurontin 300 tid 07/13/2013 > clearly responded until onset of uir    Classic Upper airway cough syndrome, so named because it's frequently impossible to sort out how much is  CR/sinusitis with freq throat clearing (which can be related to primary GERD)   vs  causing  secondary (" extra esophageal")  GERD from wide swings in gastric pressure that occur with throat clearing, often  promoting self use of mint and menthol lozenges that reduce the lower esophageal sphincter tone and exacerbate the problem further in a cyclical fashion.   These are the same pts (now being labeled as having "irritable larynx syndrome" by some cough centers) who not infrequently have a history of having failed to tolerate ace inhibitors,  dry powder inhalers or biphosphonates or report having atypical reflux symptoms that don't respond to standard doses of PPI , and are easily confused as having aecopd or asthma flares by even experienced allergists/ pulmonologists.   Explained the natural history of uri and why it's necessary in patients at risk to treat GERD aggressively - at least  short term -   to reduce risk of evolving cyclical cough initially  triggered by epithelial injury and a heightened sensitivty to the effects of any upper airway irritants,  most importantly acid - related - then perpetuated by epithelial injury related to the cough itself as the upper airway collapses on itself.  That is, the more sensitive the epithelium becomes once it is damaged by the virus, the more the ensuing irritability> the more the cough, the more the secondary reflux (especially in those prone to reflux) the more the irritation of the sensitive mucosa and so on in a  Classic cyclical pattern.    For now rx as sinusitis with augmentin then sinus ct next Eliminate cyclical  cough with tramadol which worked in the past  See instructions for specific recommendations which were reviewed directly with the patient who was given a copy with highlighter outlining the key components.

## 2014-08-05 NOTE — Assessment & Plan Note (Signed)
Changed acei to arb effective 10/25/2012 due to atypical asthma symptoms> marked improvement on f/u 12/06/2012   Adequate control on present rx, reviewed > no change in rx needed  (avoid acei in pt with uacs indefinitely)

## 2014-08-21 ENCOUNTER — Ambulatory Visit (INDEPENDENT_AMBULATORY_CARE_PROVIDER_SITE_OTHER): Payer: BLUE CROSS/BLUE SHIELD | Admitting: Internal Medicine

## 2014-08-21 ENCOUNTER — Ambulatory Visit (INDEPENDENT_AMBULATORY_CARE_PROVIDER_SITE_OTHER)
Admission: RE | Admit: 2014-08-21 | Discharge: 2014-08-21 | Disposition: A | Discharge status: Home / Self Care | Payer: BLUE CROSS/BLUE SHIELD | Source: Ambulatory Visit | Attending: Internal Medicine | Admitting: Internal Medicine

## 2014-08-21 ENCOUNTER — Other Ambulatory Visit (INDEPENDENT_AMBULATORY_CARE_PROVIDER_SITE_OTHER): Payer: BLUE CROSS/BLUE SHIELD

## 2014-08-21 ENCOUNTER — Encounter: Payer: Self-pay | Admitting: Internal Medicine

## 2014-08-21 DIAGNOSIS — R05 Cough: Secondary | ICD-10-CM

## 2014-08-21 DIAGNOSIS — R058 Other specified cough: Secondary | ICD-10-CM

## 2014-08-21 LAB — CBC WITH DIFFERENTIAL/PLATELET
BASOS PCT: 0.3 % (ref 0.0–3.0)
Basophils Absolute: 0 10*3/uL (ref 0.0–0.1)
EOS ABS: 0.1 10*3/uL (ref 0.0–0.7)
Eosinophils Relative: 2 % (ref 0.0–5.0)
HCT: 36.3 % (ref 36.0–46.0)
Hemoglobin: 12.7 g/dL (ref 12.0–15.0)
Lymphocytes Relative: 51.9 % — ABNORMAL HIGH (ref 12.0–46.0)
Lymphs Abs: 3.8 10*3/uL (ref 0.7–4.0)
MCHC: 35 g/dL (ref 30.0–36.0)
MCV: 94.8 fl (ref 78.0–100.0)
Monocytes Absolute: 0.4 10*3/uL (ref 0.1–1.0)
Monocytes Relative: 5.9 % (ref 3.0–12.0)
Neutro Abs: 2.9 10*3/uL (ref 1.4–7.7)
Neutrophils Relative %: 39.9 % — ABNORMAL LOW (ref 43.0–77.0)
Platelets: 220 10*3/uL (ref 150.0–400.0)
RBC: 3.83 Mil/uL — ABNORMAL LOW (ref 3.87–5.11)
RDW: 14.2 % (ref 11.5–15.5)
WBC: 7.2 10*3/uL (ref 4.0–10.5)

## 2014-08-21 MED ORDER — TRAMADOL HCL 50 MG PO TABS: ORAL_TABLET | ORAL | Status: DC

## 2014-08-21 MED ORDER — AMOXICILLIN-POT CLAVULANATE 875-125 MG PO TABS: 1.0000 | ORAL_TABLET | Freq: Two times a day (BID) | ORAL | Status: DC

## 2014-08-21 MED ORDER — PREDNISONE 10 MG PO TABS: ORAL_TABLET | ORAL | Status: DC

## 2014-08-21 NOTE — Progress Notes (Signed)
Quick Note:  LMTCB ______ 

## 2014-08-21 NOTE — Patient Instructions (Addendum)
Please remember to go to the lab and x-ray department downstairs for your tests - we will call you with the results when they are available.  Please see patient coordinator before you leave today  to schedule sinus CT until 10 days  Augmentin 875 mg take one pill twice daily  X 10 days - take at breakfast and supper with large glass of water.  It would help reduce the usual side effects (diarrhea and yeast infections) if you ate cultured yogurt at lunch.   Prednisone 10 mg take  4 each am x 2 days,   2 each am x 2 days,  1 each am x 2 days and stop   Take delsym two tsp every 12 hours and supplement if needed with  tramadol 50 mg up to 2 every 4 hours to suppress the urge to cough. Swallowing water or using ice chips/non mint and menthol containing candies (such as lifesavers or sugarless jolly ranchers) are also effective.  You should rest your voice and avoid activities that you know make you cough.  Once you have eliminated the cough for 3 straight days try reducing the tramadol first,  then the delsym as tolerated  Please schedule a follow up office visit in 2 weeks, sooner if needed   Late add : ? Why back on progesterone and when restarted?   .Marland Kitchen

## 2014-08-21 NOTE — Progress Notes (Signed)
Subjective:    Patient ID: [name] ShaggyMary D [name], female    DOB: [birth year] MRN: [id number]    Brief patient profile:  [age] yowf never smoker good ex tolerance at baseline wt 160 with new onset doe x 2008 referred 10/25/2012 to pulmonary clinic by Dr Donette LarryHusain  History of Present Illness  10/25/2012 1st pulmonary eval on ACEi cc gradual worse indolent onset sob x  2008 and in addition several bad spells of sob with dx of pna rx as outpt with short term inhalers albuterol which seemed to help some - assoc with hoarseness.  Wt gain attributed to freq prednisone rx multiple times for back pain does not help breathing.  Min assoc chronic dry cough  More day than night when does cough.  rec benicar 40/25  One half daily Stop quinapril GERD diet   12/06/2012 f/u ov/[name] [name] re sob and cough Chief Complaint  Patient presents with  . Follow-up    Breathing is much improved since the last visit.  Cough has also improved, but still has occ non prod cough-worse during her work day.   took cruise, fine with walking, still occ urge to clear throat esp in am ? Worse at work also rec Try pepcid  20 mg at bedtime for about a month to see if it helps your am throat clearing. Continue benicar and let me know if insurance covers ok or not.  See [name] NP for substitutes or Dr Donette LarryHusain but avoid Ace inhbitor as a class due to your airway issues   05/19/2013 f/u ov/[name] [name] re: recurrent cough now off acei  And pepcid which corrected 100% Chief Complaint  Patient presents with  . Follow-up    Pt c/o increased cough x 7 wks- non prod and worse in the evening and at night. Having trouble sleeping. She states that she has been through 2 rounds and abx and prednisone with some relief. She is coughing to the point of vomiting about twice per day.    worst around 4 pm whether at work or not then at hs and all night long, no better with saba or qvar Most the time dry Only sob when cough  Acute onset with burning in throat  Started HRT 3  weeks prior to OV   Coughs so hard urinates also rec  Increase neurontin to three times daily until cough is gone, then resume bedtime only First take delsym two tsp every 12 hours and supplement if needed with  tramadol 50 mg up to 2 every 4 hours to suppress the urge to cough at all or even clear your throat.   Pantoprazole (protonix) 40 mg   Take 30-60 min before first meal of the day and Pepcid 20 mg one bedtime until return to office - this is the best way to tell whether stomach acid is contributing to your problem.   GERD diet  Prednisone 10 mg take  4 each am x 2 days,   2 each am x 2 days,  1 each am x 2 days and stop      06/01/2013 f/u ov/[name] [name] re: chronic cough ? Cough variant asthma  Chief Complaint  Patient presents with  . Follow-up    Pt states cough is some improved, but not much. Cough is prod in the am with minimal clear sputum.   cough worse at 3 am and better while on prednisone better p saba  Imp was ? Cough var asthma rec dulera 100 Take 2 puffs first thing  in am and then another 2 puffs about 12 hours later - if all better in two weeks fill the precription Prednisone 10 mg take  4 each am x 2 days,   2 each am x 2 days,  1 each am x 2 days and stop Continue the pepcid 20 mg after bfast and at bedtime  Continue to suppress the cough with tramadol and continue the neurontin three times daily until cough completely gone without the need for tramadol    07/13/2013 f/u ov/[name] [name] re: chronic cough  Esp bad   since Sept 2014  Chief Complaint  Patient presents with  . Follow-up    Cough continues to improve. Pt states cough is about 90% better. No new co's today. Has not had to use albuterol since her last visit.    not using anything for cough, not limited by breathing, no noct events  Rec Increase Neurontin to  300 three times a day to see if helps both feet burning and your throat clearing  > 100% improved chronically       08/04/2014 f/u ov/[name] [name] re: recurrent cough   Chief Complaint  Patient presents with  . Acute Visit    Pt c/o cough since 07/21/14- prod with moderate yellow sputum. She also SOB, wheezing, chest tightness and fatigue. [facility] and was prescribed zithromax with no help.  She is using her Dulera with not much relief.   maint on dulera 100  One bid and pepcid 20 hs  Acutely ill sore throat hacking cough became productive went to UC > zpak complete 1 week prior to OV  > still coughing up yellow mucus / worse when lie down takes her breath away and causes gen chest tightness   rec  Augmentin 875 mg take one pill twice daily  X 10 days   First take delsym two tsp every 12 hours and supplement if needed with  tramadol 50 mg up to 2 every 4 hours to suppress the urge to cough at all or even clear your throat. Swallowing water or using ice chips/non mint and menthol containing candies (such as lifesavers or sugarless jolly ranchers) are also effective.  You should rest your voice and avoid activities that you know make you cough. Once you have eliminated the cough for 3 straight days try reducing the tramadol first,  then the delsym as tolerated.   Pantoprazole (protonix) 40 mg   Take 30-60 min before first meal of the day and Pepcid 20 mg one bedtime until return to office - this is the best way to tell whether stomach acid is contributing to your problem.   GERD  Prednisone 10 mg take  4 each am x 2 days,   2 each am x 2 days,  1 each am x 2 days and stop   Please schedule a follow up office visit in 2 weeks, sooner if needed  Add needs cxr if not better and maybe sinus ct  Needs to also consider change dulera to "prn" if does ok for months on neurontin   08/21/2014 f/u ov/[name] [name] re: recurrent cough p 100% improved ? All due to  Tramadol benefit because relapsed quickly off it and used well beyond the 3 d rec Chief Complaint  Patient presents with  . Acute Visit    Pt c/o increased cough with yellow/blood tinged sputum, SOB  and runny nose x 6 days.   did not try ppi as allergic to  prevacid so started on pepcid bid ? Dose   Cough worse esp first thing in am but does not wake her up noct   No obvious daytime variabilty or assoc   cp or  subjective wheeze overt sinus or hb symptoms. No unusual exp hx or h/o childhood pna/ asthma or premature birth to herknowledge.    Also denies any obvious fluctuation of symptoms with weather or environmental changes or other aggravating or alleviating factors except as outlined above   Current Medications, Allergies, Past Medical History, Past Surgical History, Family History, and Social History were reviewed in [employer] record.  ROS  The following are not active complaints unless bolded sore throat, dysphagia, dental problems, itching, sneezing,  nasal congestion or excess/ purulent secretions, ear ache,   fever, chills, sweats, unintended wt loss, pleuritic or exertional cp, hemoptysis,  orthopnea pnd or leg swelling, presyncope, palpitations, heartburn, abdominal pain, anorexia, nausea, vomiting, diarrhea  or change in bowel or urinary habits, change in stools or urine, dysuria,hematuria,  rash, arthralgias, visual complaints, headache, numbness weakness or ataxia or problems with walking or coordination,  change in mood/affect or memory.              Objective:   Physical Exam  12/06/2012        236  > 05/19/2013  223 > 06/01/2013  224 > 07/13/2013  229 > 08/04/2014 215 > 08/21/2014 218  Wt Readings from Last 3 Encounters:  10/25/12 236 lb (107.049 kg)  03/03/12 227 lb (102.967 kg)    amb wf nad   HEENT: nl dentition, turbinates, and orophanx. Nl external ear canals without cough reflex   NECK :  without JVD/Nodes/TM/ nl carotid upstrokes bilaterally   LUNGS: no acc muscle use, clear to A and P bilaterally with no cough on  exp maneuvers   CV:  RRR  no s3 or murmur or increase in P2, no edema   ABD:  soft and nontender with nl  excursion in the supine position. No bruits or organomegaly, bowel sounds nl  MS:  warm without deformities, calf tenderness, cyanosis or clubbing  SKIN: warm and dry without lesions      CXR PA and Lateral:   08/21/2014 :     I personally reviewed images and agree with radiology impression as follows:       Cardiomegaly without acute cardiopulmonary findings.         Assessment & Plan:

## 2014-08-22 ENCOUNTER — Encounter: Payer: Self-pay | Admitting: Internal Medicine

## 2014-08-22 LAB — ALLERGY FULL PROFILE
Allergen, D pternoyssinus,d7: <0.1 kU/L
Allergen,Goose feathers, e70: <0.1 kU/L
Alternaria Alternata: <0.1 kU/L
Bahia Grass: <0.1 kU/L
Bermuda Grass: <0.1 kU/L
Candida Albicans: <0.1 kU/L
Cat Dander: <0.1 kU/L
Common Ragweed: <0.1 kU/L
Curvularia lunata: <0.1 kU/L
Elm IgE: <0.1 kU/L
G005 Rye, Perennial: <0.1 kU/L
G009 Red Top: <0.1 kU/L
Goldenrod: <0.1 kU/L
IgE (Immunoglobulin E), Serum: 23 kU/L (ref <115)
Lamb's Quarters: <0.1 kU/L
Plantain: <0.1 kU/L
Sycamore Tree: <0.1 kU/L
Timothy Grass: <0.1 kU/L

## 2014-08-22 NOTE — Assessment & Plan Note (Signed)
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   These conditions, singly or in combination, have accounted for up to 94% of the causes of chronic cough in prospective studies.   Other conditions have constituted no >6% of the causes in prospective studies These have included bronchogenic carcinoma, chronic interstitial pneumonia, sarcoidosis, left ventricular failure, ACEI-induced cough, and aspiration from a condition associated with pharyngeal dysfunction.    Chronic cough is often simultaneously caused by more than one condition. A single cause has been found from 38 to 82% of the time, multiple causes from 18 to 62%. Multiply caused cough has been the result of three diseases up to 42% of the time.       Not clear now what's caused the relapse (note not able to take full ppi rx and now back on progesterone) and whether the prev rx was better just because she suppressed the cough well beyond the intended 3 days to interrupt the cycle >  rec repeat the rx but this time do it as instructed and reviewed line for lline  Will go ahead also and w/u for sinus dz/allergy  See instructions for specific recommendations which were reviewed directly with the patient who was given a copy with highlighter outlining the key components.

## 2014-08-31 ENCOUNTER — Ambulatory Visit (INDEPENDENT_AMBULATORY_CARE_PROVIDER_SITE_OTHER)
Admission: RE | Admit: 2014-08-31 | Discharge: 2014-08-31 | Disposition: A | Discharge status: Home / Self Care | Payer: BLUE CROSS/BLUE SHIELD | Source: Ambulatory Visit | Attending: Internal Medicine | Admitting: Internal Medicine

## 2014-08-31 DIAGNOSIS — R05 Cough: Secondary | ICD-10-CM

## 2014-08-31 DIAGNOSIS — R058 Other specified cough: Secondary | ICD-10-CM

## 2014-09-04 ENCOUNTER — Encounter: Payer: Self-pay | Admitting: Internal Medicine

## 2014-09-04 ENCOUNTER — Ambulatory Visit (INDEPENDENT_AMBULATORY_CARE_PROVIDER_SITE_OTHER): Payer: BLUE CROSS/BLUE SHIELD | Admitting: Internal Medicine

## 2014-09-04 ENCOUNTER — Encounter (INDEPENDENT_AMBULATORY_CARE_PROVIDER_SITE_OTHER): Payer: Self-pay

## 2014-09-04 VITALS — BP 142/84 | HR 82 | Ht 63.0 in | Wt 218.0 lb

## 2014-09-04 DIAGNOSIS — R05 Cough: Secondary | ICD-10-CM

## 2014-09-04 DIAGNOSIS — R058 Other specified cough: Secondary | ICD-10-CM

## 2014-09-04 MED ORDER — BENZONATATE 200 MG PO CAPS: ORAL_CAPSULE | ORAL | Status: DC

## 2014-09-04 NOTE — Progress Notes (Signed)
Subjective:    Patient ID: [name] [name], female    DOB: [birth year] MRN: [id number]    Brief patient profile:  [age] yowf never smoker good ex tolerance at baseline wt 160 with new onset doe x 2008 referred 10/25/2012 to pulmonary clinic by Dr Donette LarryHusain  History of Present Illness  10/25/2012 1st pulmonary eval on ACEi cc gradual worse indolent onset sob x  2008 and in addition several bad spells of sob with dx of pna rx as outpt with short term inhalers albuterol which seemed to help some - assoc with hoarseness.  Wt gain attributed to freq prednisone rx multiple times for back pain does not help breathing.  Min assoc chronic dry cough  More day than night when does cough.  rec benicar 40/25  One half daily Stop quinapril GERD diet   12/06/2012 f/u ov/Deicy Rusk re sob and cough Chief Complaint  Patient presents with  . Follow-up    Breathing is much improved since the last visit.  Cough has also improved, but still has occ non prod cough-worse during her work day.   took cruise, fine with walking, still occ urge to clear throat esp in am ? Worse at work also rec Try pepcid  20 mg at bedtime for about a month to see if it helps your am throat clearing. Continue benicar and let me know if insurance covers ok or not.  See [name] NP for substitutes or Dr Donette LarryHusain but avoid Ace inhbitor as a class due to your airway issues   05/19/2013 f/u ov/Kenedie Dirocco re: recurrent cough now off acei  And pepcid which corrected 100% Chief Complaint  Patient presents with  . Follow-up    Pt c/o increased cough x 7 wks- non prod and worse in the evening and at night. Having trouble sleeping. She states that she has been through 2 rounds and abx and prednisone with some relief. She is coughing to the point of vomiting about twice per day.    worst around 4 pm whether at work or not then at hs and all night long, no better with saba or qvar Most the time dry Only sob when cough  Acute onset with burning in throat  Started HRT 3  weeks prior to OV   Coughs so hard urinates also rec  Increase neurontin to three times daily until cough is gone, then resume bedtime only First take delsym two tsp every 12 hours and supplement if needed with  tramadol 50 mg up to 2 every 4 hours to suppress the urge to cough at all or even clear your throat.   Pantoprazole (protonix) 40 mg   Take 30-60 min before first meal of the day and Pepcid 20 mg one bedtime until return to office - this is the best way to tell whether stomach acid is contributing to your problem.   GERD diet  Prednisone 10 mg take  4 each am x 2 days,   2 each am x 2 days,  1 each am x 2 days and stop      06/01/2013 f/u ov/Kee Drudge re: chronic cough ? Cough variant asthma  Chief Complaint  Patient presents with  . Follow-up    Pt states cough is some improved, but not much. Cough is prod in the am with minimal clear sputum.   cough worse at 3 am and better while on prednisone better p saba  Imp was ? Cough var asthma rec dulera 100 Take 2 puffs first thing  in am and then another 2 puffs about 12 hours later - if all better in two weeks fill the precription Prednisone 10 mg take  4 each am x 2 days,   2 each am x 2 days,  1 each am x 2 days and stop Continue the pepcid 20 mg after bfast and at bedtime  Continue to suppress the cough with tramadol and continue the neurontin three times daily until cough completely gone without the need for tramadol    07/13/2013 f/u ov/Zanasia Hickson re: chronic cough  Esp bad   since Sept 2014  Chief Complaint  Patient presents with  . Follow-up    Cough continues to improve. Pt states cough is about 90% better. No new co's today. Has not had to use albuterol since her last visit.    not using anything for cough, not limited by breathing, no noct events  Rec Increase Neurontin to  300 three times a day to see if helps both feet burning and your throat clearing  > 100% improved chronically       08/04/2014 f/u ov/Rolfe Hartsell re: recurrent cough   Chief Complaint  Patient presents with  . Acute Visit    Pt c/o cough since 07/21/14- prod with moderate yellow sputum. She also SOB, wheezing, chest tightness and fatigue. [facility] and was prescribed zithromax with no help.  She is using her Dulera with not much relief.   maint on dulera 100  One bid and pepcid 20 hs  Acutely ill sore throat hacking cough became productive went to UC > zpak complete 1 week prior to OV  > still coughing up yellow mucus / worse when lie down takes her breath away and causes gen chest tightness   rec  Augmentin 875 mg take one pill twice daily  X 10 days   First take delsym two tsp every 12 hours and supplement if needed with  tramadol 50 mg up to 2 every 4 hours to suppress the urge to cough at all or even clear your throat. Swallowing water or using ice chips/non mint and menthol containing candies (such as lifesavers or sugarless jolly ranchers) are also effective.  You should rest your voice and avoid activities that you know make you cough. Once you have eliminated the cough for 3 straight days try reducing the tramadol first,  then the delsym as tolerated.   Pantoprazole (protonix) 40 mg   Take 30-60 min before first meal of the day and Pepcid 20 mg one bedtime until return to office - this is the best way to tell whether stomach acid is contributing to your problem.   GERD  Prednisone 10 mg take  4 each am x 2 days,   2 each am x 2 days,  1 each am x 2 days and stop   Please schedule a follow up office visit in 2 weeks, sooner if needed  Add needs cxr if not better and maybe sinus ct  Needs to also consider change dulera to "prn" if does ok for months on neurontin   08/21/2014 f/u ov/Lakeithia Rasor re: recurrent cough p 100% improved ? All due to  Tramadol benefit because relapsed quickly off it and used well beyond the 3 d rec Chief Complaint  Patient presents with  . Acute Visit    Pt c/o increased cough with yellow/blood tinged sputum, SOB  and runny nose x 6 days.   did not try ppi as allergic to  prevacid so started on pepcid bid ? Dose   Cough worse esp first thing in am but does not wake her up noct rec Please remember to go to the lab and x-ray department downstairs for your tests - we will call you with the results when they are available. Please see patient coordinator before you leave today  to schedule sinus CT until 10 days> neg  Augmentin 875 mg take one pill twice daily  X 10 days - take at breakfast and supper with large glass of water.  It would help reduce the usual side effects (diarrhea and yeast infections) if you ate cultured yogurt at lunch.  Prednisone 10 mg take  4 each am x 2 days,   2 each am x 2 days,  1 each am x 2 days and stop  Take delsym two tsp every 12 hours and supplement if needed with  tramadol 50 mg up to 2 every 4 hours Once you have eliminated the cough for 3 straight days try reducing the tramadol first,  then the delsym as tolerated Please schedule a follow up office visit in 2 weeks, sooner if needed   Late add : ? Why back on progesterone and when restarted >      09/04/2014 f/u ov/Nikeisha Klutz re: cough since Dec 25th 2015 with [facility] Rudie Meyer throat symptoms resolved  Chief Complaint  Patient presents with  . Follow-up    Pt states that her cough has improved some, but has not resolved. No new c/o's today.   cough worse when feet hit the floor > min productive  On dulera 100 2 each am  Even on prednisone / tramadol still felt urge to cough in am  On prometrium from wellness specialist no specific indication    No obvious daytime variabilty or assoc   cp or  subjective wheeze overt sinus or hb symptoms. No unusual exp hx or h/o childhood pna/ asthma or premature birth to herknowledge.    Also denies any obvious fluctuation of symptoms with weather or environmental changes or other aggravating or alleviating factors except as outlined above   Current Medications, Allergies, Past Medical History,  Past Surgical History, Family History, and Social History were reviewed in [employer] record.  ROS  The following are not active complaints unless bolded sore throat, dysphagia, dental problems, itching, sneezing,  nasal congestion or excess/ purulent secretions, ear ache,   fever, chills, sweats, unintended wt loss, pleuritic or exertional cp, hemoptysis,  orthopnea pnd or leg swelling, presyncope, palpitations, heartburn, abdominal pain, anorexia, nausea, vomiting, diarrhea  or change in bowel or urinary habits, change in stools or urine, dysuria,hematuria,  rash, arthralgias, visual complaints, headache, numbness weakness or ataxia or problems with walking or coordination,  change in mood/affect or memory.              Objective:   Physical Exam  12/06/2012        236  > 05/19/2013  223 > 06/01/2013  224 > 07/13/2013  229 > 08/04/2014 215 > 08/21/2014 218 > 09/04/2014  218  Wt Readings from Last 3 Encounters:  10/25/12 236 lb (107.049 kg)  03/03/12 227 lb (102.967 kg)    amb wf nad with voice fatigue and freq throat clearing >  dry quality   HEENT: nl dentition, turbinates, and orophanx. Nl external ear canals without cough reflex   NECK :  without JVD/Nodes/TM/ nl carotid upstrokes bilaterally   LUNGS: no acc muscle use, clear to  A and P bilaterally with no cough on  exp maneuvers   CV:  RRR  no s3 or murmur or increase in P2, no edema   ABD:  soft and nontender with nl excursion in the supine position. No bruits or organomegaly, bowel sounds nl  MS:  warm without deformities, calf tenderness, cyanosis or clubbing  SKIN: warm and dry without lesions      CXR PA and Lateral:   08/21/2014 :     I personally reviewed images and agree with radiology impression as follows:       Cardiomegaly without acute cardiopulmonary findings.         Assessment & Plan:

## 2014-09-04 NOTE — Patient Instructions (Addendum)
Pepcid 20 mg after breakfast and at bedtime   For drainage take chlortrimeton (chlorpheniramine) 4 mg every 4 hours available over the counter (may cause drowsiness) - take 2 at bedtime  For tickle/cough try tessalon 200 mg every 4 hours as needed   Rec stop prometrium and dulera  GERD (REFLUX)  is an extremely common cause of respiratory symptoms just like yours , many times with no obvious heartburn at all.    It can be treated with medication, but also with lifestyle changes including avoidance of late meals, excessive alcohol, smoking cessation, and avoid fatty foods, chocolate, peppermint, colas, red wine, and acidic juices such as orange juice.  NO MINT OR MENTHOL PRODUCTS SO NO COUGH DROPS  USE SUGARLESS CANDY INSTEAD (Jolley ranchers or Stover's or Life Savers) or even ice chips will also do - the key is to swallow to prevent all throat clearing. NO OIL BASED VITAMINS - use powdered substitutes.  Please schedule a follow up office visit in 4 weeks, sooner if needed

## 2014-09-04 NOTE — Assessment & Plan Note (Signed)
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 A single cause has been found from 38 to 82% of the time, multiple causes from 18 to 62%. Multiply caused cough has been the result of three diseases up to 42% of the time.   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  Please see instructions for details which were reviewed in writing and the patient given a copy.

## 2014-09-05 ENCOUNTER — Telehealth: Payer: Self-pay | Admitting: Internal Medicine

## 2014-09-05 NOTE — Telephone Encounter (Signed)
Called made pt aware. Nothing further needed 

## 2014-09-05 NOTE — Telephone Encounter (Signed)
Dr Sherene SiresWert- pharmacy calling to clarify that the tessalon 200 mg should be taken 4 x per day Typically is is taken tid  Please advise thanks

## 2014-09-05 NOTE — Telephone Encounter (Signed)
Yes, dose is higher than usual based on pts size and severity of cough

## 2014-12-02 ENCOUNTER — Telehealth: Payer: Self-pay | Admitting: Physician Assistant

## 2014-12-02 ENCOUNTER — Ambulatory Visit (HOSPITAL_COMMUNITY)
Admission: RE | Admit: 2014-12-02 | Discharge: 2014-12-02 | Disposition: A | Discharge status: Home / Self Care | Payer: BLUE CROSS/BLUE SHIELD | Source: Ambulatory Visit | Attending: Physician Assistant | Admitting: Physician Assistant

## 2014-12-02 ENCOUNTER — Ambulatory Visit (INDEPENDENT_AMBULATORY_CARE_PROVIDER_SITE_OTHER): Payer: BLUE CROSS/BLUE SHIELD | Admitting: Physician Assistant

## 2014-12-02 VITALS — BP 146/84 | HR 81 | Temp 97.9°F | Resp 18 | Ht 64.0 in | Wt 217.0 lb

## 2014-12-02 DIAGNOSIS — R519 Headache, unspecified: Secondary | ICD-10-CM

## 2014-12-02 DIAGNOSIS — G44019 Episodic cluster headache, not intractable: Secondary | ICD-10-CM

## 2014-12-02 DIAGNOSIS — R51 Headache: Secondary | ICD-10-CM

## 2014-12-02 DIAGNOSIS — G44009 Cluster headache syndrome, unspecified, not intractable: Secondary | ICD-10-CM | POA: Insufficient documentation

## 2014-12-02 MED ORDER — KETOROLAC TROMETHAMINE 60 MG/2ML IM SOLN
60.0000 mg | Freq: Once | INTRAMUSCULAR | Status: AC
  Administered 2014-12-02: 60 mg via INTRAMUSCULAR

## 2014-12-02 MED ORDER — ONDANSETRON 4 MG PO TBDP
4.0000 mg | ORAL_TABLET | Freq: Once | ORAL | Status: AC
  Administered 2014-12-02: 4 mg via ORAL

## 2014-12-02 MED ORDER — SUMATRIPTAN 20 MG/ACT NA SOLN: 20.0000 mg | NASAL | Status: DC | PRN

## 2014-12-02 NOTE — Telephone Encounter (Signed)
Patient called and states that her head pain has returned and it is in "full force". What should she do?  [phone]

## 2014-12-02 NOTE — Telephone Encounter (Signed)
Attempted to call pt to inform of CT results. Mailbox full.

## 2014-12-02 NOTE — Telephone Encounter (Signed)
Attempted to contact pt, not able to leave VM. Please let her know to try the nasal sumatriptan we prescribed. If that takes it away great. If not she should come on back and we can treat her here again.

## 2014-12-02 NOTE — Progress Notes (Signed)
Subjective:    Patient ID: [name] [name], female    DOB: 11/14/1950, 64 y.o.   MRN: [id number]  Chief Complaint  Patient presents with  . Migraine    3 yesterday    Prior to Admission medications   Medication Sig Start Date End Date Taking? Authorizing Provider  aspirin EC 81 MG tablet Take 81 mg by mouth daily.   Yes Historical Provider, MD  gabapentin (NEURONTIN) 300 MG capsule Take 1 capsule (300 mg total) by mouth 3 (three) times daily. 07/13/13  Yes Nyoka CowdenMichael B Wert, MD  MAGNESIUM PO Take 1,300 mg by mouth daily.   Yes Historical Provider, MD  metFORMIN (GLUCOPHAGE) 500 MG tablet Take 500 mg by mouth 2 (two) times daily with a meal.   Yes Historical Provider, MD  Methylcobalamin (METHYL B-12 PO) Take 1 tablet by mouth daily.   Yes Historical Provider, MD  metoprolol succinate (TOPROL-XL) 25 MG 24 hr tablet Take 12.5 mg by mouth 2 (two) times daily.   Yes Historical Provider, MD  olmesartan-hydrochlorothiazide (BENICAR HCT) 40-25 MG per tablet Take 0.5 tablets by mouth daily.   Yes Historical Provider, MD  VITAMIN D-VITAMIN K PO Take 1 tablet by mouth daily.   Yes Historical Provider, MD  zolpidem (AMBIEN) 10 MG tablet Take 10 mg by mouth at bedtime.   Yes Historical Provider, MD  escitalopram (LEXAPRO) 20 MG tablet Take 20 mg by mouth daily.    Historical Provider, MD  lidocaine (LIDODERM) 5 % Place 1 patch onto the skin daily. Remove & Discard patch within 12 hours or as directed by MD    Historical Provider, MD  OVER THE COUNTER MEDICATION Take 400 mg by mouth daily. Super Bio-Curcumin    Historical Provider, MD  oxyCODONE-acetaminophen (ROXICET) 5-325 MG per tablet Take 1-2 tablets by mouth every 6 (six) hours as needed for severe pain. Patient not taking: Reported on 12/02/2014 12/01/13   Elodia FlorenceAndrew Nida, PA-C  simvastatin (ZOCOR) 20 MG tablet Take 20 mg by mouth every evening.    Historical Provider, MD  SUMAtriptan (IMITREX) 20 MG/ACT nasal spray Place 1 spray (20 mg total) into the nose  every 2 (two) hours as needed for migraine or headache. May repeat in 2 hours if headache persists or recurs. 12/02/14   Huey Bienenstockodd M Elaysia Devargas, PA  Ubiquinol 100 MG CAPS Take 100 mg by mouth daily.    Historical Provider, MD   Medications, allergies, past medical history, surgical history, family history, social history and problem list reviewed and updated.  HPI  [age] yof presents with c/o left sided HA since yest.   HA started suddenly yest at work. Prior to HA starting had slight changes in vision with an aura in right eye, nothing in left eye. HA located over left eye. Assoc with slightly decreased vision at tearing from left eye. Has been intermittent past 24 hrs. Comes on for approx one hour at time. Then will relieve on own for mins-hrs. Has had approx 5-6 episodes of this past 24 hrs. She thinks her face is slightly swollen around left eye. Having assoc n/v with the HAs. States she is crying from the pain when they come on. Worst HA of life. HA is currently somewhat resolved. Feels her vision is normal currently.   Had similar episode 3 yrs ago. Had repeated episodes of the HAs over a course of several weeks. Was treated with a shot at that time. No recurrence till yest.   Review of Systems No fevers,  chills.     Objective:   Physical Exam  Constitutional: She is oriented to person, place, and time. She appears well-developed and well-nourished.  Non-toxic appearance. She does not have a sickly appearance. She does not appear ill. No distress.  BP 146/84 mmHg  Pulse 81  Temp(Src) 97.9 F (36.6 C) (Oral)  Resp 18  Ht 5\' 4"  (1.626 m)  Wt 217 lb (98.431 kg)  BMI 37.23 kg/m2  SpO2 95%  LMP 03/03/2012   HENT:  Right Ear: Tympanic membrane normal.  Left Ear: Tympanic membrane normal.  Eyes: Conjunctivae and EOM are normal. Pupils are equal, round, and reactive to light. Right conjunctiva is not injected. Left conjunctiva is not injected. Right eye exhibits normal extraocular motion. Left eye  exhibits normal extraocular motion. Pupils are equal.  Mild edema infra orbital left eye. No tearing.   Neck: No Brudzinski's sign noted.  Neurological: She is alert and oriented to person, place, and time. She has normal strength. No cranial nerve deficit or sensory deficit.  Psychiatric: She has a normal mood and affect. Her speech is normal and behavior is normal.      Assessment & Plan:   [age] yof presents with c/o left sided HA since yest.   Episodic cluster headache, not intractable - Plan: ketorolac (TORADOL) injection 60 mg, ondansetron (ZOFRAN-ODT) disintegrating tablet 4 mg, SUMAtriptan (IMITREX) 20 MG/ACT nasal spray, CT Head Wo Contrast Worst headache of life - Plan: CT Head Wo Contrast --migraine vs cluster --> suspect cluster as centered over left eye, occurs in clusters, severe, assoc with left eye tearing and decreased vision which returns to normal between attacks --60 mg IM toradol, 4 mg odt zofran in clinic --6L O2 in clinic with mask for approx 20 mins --pt felt much improved after these measures --nasal sumatriptan for abortive measure for home --referred for stat head ncct today as is worst HA of life to r/o sah, ensure no structural defect causing clusters  Donnajean Lopesodd M. Syaire Saber, PA-C Physician Assistant-Certified [facility]  12/02/2014 3:51 PM

## 2014-12-02 NOTE — Patient Instructions (Signed)
We gave you a shot of Toradol, zofran, and oxygen today.  We are referring you for a head CT today. We are doing this to ensure there is not a structural problem that is causing your to have these headaches.  We have prescribed you intranasal sumatriptan to take when you get these headaches at home.  If you get these headaches again please come back for a similar treatment.   Cluster Headache Cluster headaches are recognized by their pattern of deep, intense head pain. They normally occur on one side of your head, but they may "switch sides" in subsequent episodes. Typically, cluster headaches:   Are severe in nature.   Occur repeatedly over weeks to months and are followed by periods of no headaches.   Can last from 15 minutes to 3 hours.   Occur at the same time each day, often at night.   Occur several times a day. CAUSES The exact cause of cluster headaches is not known. Alcohol use may be associated with cluster headaches. SIGNS AND SYMPTOMS   Severe pain that begins in or around your eye or temple.   One-sided head pain.   Feeling sick to your stomach (nauseous).   Sensitivity to light.   Runny nose.   Eye redness, tearing, and nasal stuffiness on the side of your head where you are experiencing pain.   Sweaty, pale skin of the face.   Droopy or swollen eyelid.   Restlessness. DIAGNOSIS  Cluster headaches are diagnosed based on symptoms and a physical exam. Your health care provider may order a CT scan or an MRI of your head or lab tests to see if your headaches are caused by other medical conditions.  TREATMENT   Medicines for pain relief and to prevent recurrent attacks. Some people may need a combination of medicines.  Oxygen for pain relief.   Biofeedback programs to help reduce headache pain.  It may be helpful to keep a headache diary. This may help you find a trend for what is triggering your headaches. Your health care provider can develop a  treatment plan.  HOME CARE INSTRUCTIONS  During cluster periods:   Follow a regular sleep schedule. Do not vary the amount and time that you sleep from day to day. It is important to stay on the same schedule during a cluster period to help prevent headaches.   Avoid alcohol.   Stop smoking if you smoke.  SEEK MEDICAL CARE IF:  You have any changes from your previous cluster headaches either in intensity or frequency.   You are not getting relief from medicines you are taking.  SEEK IMMEDIATE MEDICAL CARE IF:   You faint.   You have weakness or numbness, especially on one side of your body or face.   You have double vision.   You have nausea or vomiting that is not relieved within several hours.   You cannot keep your balance or have difficulty talking or walking.   You have neck pain or stiffness.   You have a fever. MAKE SURE YOU:  Understand these instructions.   Will watch your condition.   Will get help right away if you are not doing well or get worse. Document Released: 07/14/2005 Document Revised: 05/04/2013 Document Reviewed: 02/03/2013 [facility] Patient Information 2015 [employer] [place]. This information is not intended to replace advice given to you by your health care provider. Make sure you discuss any questions you have with your health care provider.

## 2014-12-04 ENCOUNTER — Telehealth: Payer: Self-pay

## 2014-12-04 NOTE — Telephone Encounter (Signed)
Spoke with pt this morning, sent Tawanna Coolerodd another message.

## 2014-12-04 NOTE — Telephone Encounter (Signed)
   Pt called very upset stating that she did not get a phone call from us about her results. I apologized to the patient and reassured her that we did vall twice yesterday. I gave pt her results, pt understood. FYI Todd     Notes Recorded by Vira AgarMichelle L Farrington, Rad Tech on 12/03/2014 at 3:39 PM Unable to leave vm. ------  Notes Recorded by Donnajean Lopesodd M McVeigh, PA on 12/03/2014 at 8:24 AM I attempted to call yesterday but could not leave a message. Please let her know her scan did not show anything that could be causing tension headaches, but she did have "moderate chronic microvascular ischemic change". She should be sure to follow up with her PCP. We can get her a copy of the scan if she'd like.

## 2014-12-05 ENCOUNTER — Other Ambulatory Visit: Payer: Self-pay | Admitting: Internal Medicine

## 2014-12-05 DIAGNOSIS — R270 Ataxia, unspecified: Secondary | ICD-10-CM

## 2014-12-07 ENCOUNTER — Ambulatory Visit
Admission: RE | Admit: 2014-12-07 | Discharge: 2014-12-07 | Disposition: A | Discharge status: Home / Self Care | Payer: BLUE CROSS/BLUE SHIELD | Source: Ambulatory Visit | Attending: Internal Medicine | Admitting: Internal Medicine

## 2014-12-07 DIAGNOSIS — R270 Ataxia, unspecified: Secondary | ICD-10-CM

## 2014-12-14 ENCOUNTER — Encounter (HOSPITAL_BASED_OUTPATIENT_CLINIC_OR_DEPARTMENT_OTHER): Payer: Self-pay | Admitting: *Deleted

## 2014-12-14 ENCOUNTER — Emergency Department (HOSPITAL_BASED_OUTPATIENT_CLINIC_OR_DEPARTMENT_OTHER)
Admission: EM | Admit: 2014-12-14 | Discharge: 2014-12-14 | Disposition: A | Discharge status: Home / Self Care | Payer: BLUE CROSS/BLUE SHIELD | Attending: Emergency Medicine | Admitting: Emergency Medicine

## 2014-12-14 ENCOUNTER — Other Ambulatory Visit (HOSPITAL_BASED_OUTPATIENT_CLINIC_OR_DEPARTMENT_OTHER): Payer: BLUE CROSS/BLUE SHIELD

## 2014-12-14 ENCOUNTER — Emergency Department (HOSPITAL_BASED_OUTPATIENT_CLINIC_OR_DEPARTMENT_OTHER): Payer: BLUE CROSS/BLUE SHIELD

## 2014-12-14 DIAGNOSIS — I10 Essential (primary) hypertension: Secondary | ICD-10-CM | POA: Diagnosis not present

## 2014-12-14 DIAGNOSIS — Z79899 Other long term (current) drug therapy: Secondary | ICD-10-CM | POA: Diagnosis not present

## 2014-12-14 DIAGNOSIS — F329 Major depressive disorder, single episode, unspecified: Secondary | ICD-10-CM | POA: Insufficient documentation

## 2014-12-14 DIAGNOSIS — Z8619 Personal history of other infectious and parasitic diseases: Secondary | ICD-10-CM | POA: Diagnosis not present

## 2014-12-14 DIAGNOSIS — Z8701 Personal history of pneumonia (recurrent): Secondary | ICD-10-CM | POA: Insufficient documentation

## 2014-12-14 DIAGNOSIS — Z7982 Long term (current) use of aspirin: Secondary | ICD-10-CM | POA: Insufficient documentation

## 2014-12-14 DIAGNOSIS — I499 Cardiac arrhythmia, unspecified: Secondary | ICD-10-CM | POA: Insufficient documentation

## 2014-12-14 DIAGNOSIS — Z8719 Personal history of other diseases of the digestive system: Secondary | ICD-10-CM | POA: Insufficient documentation

## 2014-12-14 DIAGNOSIS — R519 Headache, unspecified: Secondary | ICD-10-CM

## 2014-12-14 DIAGNOSIS — E119 Type 2 diabetes mellitus without complications: Secondary | ICD-10-CM | POA: Diagnosis not present

## 2014-12-14 DIAGNOSIS — F419 Anxiety disorder, unspecified: Secondary | ICD-10-CM | POA: Insufficient documentation

## 2014-12-14 DIAGNOSIS — Z9981 Dependence on supplemental oxygen: Secondary | ICD-10-CM | POA: Diagnosis not present

## 2014-12-14 DIAGNOSIS — G4733 Obstructive sleep apnea (adult) (pediatric): Secondary | ICD-10-CM | POA: Insufficient documentation

## 2014-12-14 DIAGNOSIS — J45909 Unspecified asthma, uncomplicated: Secondary | ICD-10-CM | POA: Diagnosis not present

## 2014-12-14 DIAGNOSIS — Z872 Personal history of diseases of the skin and subcutaneous tissue: Secondary | ICD-10-CM | POA: Diagnosis not present

## 2014-12-14 DIAGNOSIS — E785 Hyperlipidemia, unspecified: Secondary | ICD-10-CM | POA: Diagnosis not present

## 2014-12-14 DIAGNOSIS — R51 Headache: Secondary | ICD-10-CM | POA: Insufficient documentation

## 2014-12-14 LAB — CSF CELL COUNT WITH DIFFERENTIAL
Eosinophils, CSF: NONE SEEN % (ref 0–1)
Eosinophils, CSF: NONE SEEN % (ref 0–1)
RBC Count, CSF: 5 /mm3 — ABNORMAL HIGH
RBC Count, CSF: 72 /mm3 — ABNORMAL HIGH
SEGMENTED NEUTROPHILS-CSF: NONE SEEN % (ref 0–6)
Segmented Neutrophils-CSF: NONE SEEN % (ref 0–6)
Tube #: 1
Tube #: 4
WBC, CSF: 1 /mm3 (ref 0–5)
WBC, CSF: 3 /mm3 (ref 0–5)

## 2014-12-14 LAB — BASIC METABOLIC PANEL
Anion gap: 9 (ref 5–15)
BUN: 21 mg/dL — ABNORMAL HIGH (ref 6–20)
CALCIUM: 9.8 mg/dL (ref 8.9–10.3)
CO2: 25 mmol/L (ref 22–32)
CREATININE: 0.68 mg/dL (ref 0.44–1.00)
Chloride: 101 mmol/L (ref 101–111)
GFR calc Af Amer: >60 mL/min (ref >60)
GFR calc non Af Amer: >60 mL/min (ref >60)
GLUCOSE: 183 mg/dL — AB (ref 65–99)
Potassium: 4 mmol/L (ref 3.5–5.1)
Sodium: 135 mmol/L (ref 135–145)

## 2014-12-14 LAB — CBC
HEMATOCRIT: 37.2 % (ref 36.0–46.0)
Hemoglobin: 13 g/dL (ref 12.0–15.0)
MCH: 33.5 pg (ref 26.0–34.0)
MCHC: 34.9 g/dL (ref 30.0–36.0)
MCV: 95.9 fL (ref 78.0–100.0)
Platelets: 211 10*3/uL (ref 150–400)
RBC: 3.88 MIL/uL (ref 3.87–5.11)
RDW: 12.1 % (ref 11.5–15.5)
WBC: 6.7 10*3/uL (ref 4.0–10.5)

## 2014-12-14 LAB — PROTEIN, CSF: Total  Protein, CSF: 69 mg/dL — ABNORMAL HIGH (ref 15–45)

## 2014-12-14 LAB — GLUCOSE, CSF: Glucose, CSF: 103 mg/dL — ABNORMAL HIGH (ref 40–70)

## 2014-12-14 MED ORDER — DEXAMETHASONE SODIUM PHOSPHATE 10 MG/ML IJ SOLN
10.0000 mg | Freq: Once | INTRAMUSCULAR | Status: AC
  Administered 2014-12-14: 10 mg via INTRAVENOUS
  Filled 2014-12-14: qty 1

## 2014-12-14 MED ORDER — SODIUM CHLORIDE 0.9 % IV SOLN
Freq: Once | INTRAVENOUS | Status: AC
  Administered 2014-12-14: 11:00:00 via INTRAVENOUS

## 2014-12-14 MED ORDER — METOCLOPRAMIDE HCL 5 MG/ML IJ SOLN
10.0000 mg | Freq: Once | INTRAMUSCULAR | Status: AC
  Administered 2014-12-14: 10 mg via INTRAVENOUS
  Filled 2014-12-14: qty 2

## 2014-12-14 MED ORDER — DIPHENHYDRAMINE HCL 50 MG/ML IJ SOLN
25.0000 mg | Freq: Once | INTRAMUSCULAR | Status: AC
  Administered 2014-12-14: 25 mg via INTRAVENOUS
  Filled 2014-12-14: qty 1

## 2014-12-14 MED ORDER — ONDANSETRON HCL 4 MG/2ML IJ SOLN
4.0000 mg | Freq: Once | INTRAMUSCULAR | Status: AC
  Administered 2014-12-14: 4 mg via INTRAVENOUS
  Filled 2014-12-14: qty 2

## 2014-12-14 MED ORDER — TETRACAINE HCL 0.5 % OP SOLN
2.0000 [drp] | Freq: Once | OPHTHALMIC | Status: AC
  Administered 2014-12-14: 2 [drp] via OPHTHALMIC
  Filled 2014-12-14: qty 2

## 2014-12-14 MED ORDER — IOHEXOL 350 MG/ML SOLN
100.0000 mL | Freq: Once | INTRAVENOUS | Status: AC | PRN
  Administered 2014-12-14: 100 mL via INTRAVENOUS

## 2014-12-14 NOTE — Discharge Instructions (Signed)

## 2014-12-14 NOTE — ED Notes (Signed)
Crackers and diet gingerale given per pt request.

## 2014-12-14 NOTE — ED Notes (Signed)
MD at bedside for LP.

## 2014-12-14 NOTE — ED Provider Notes (Signed)
Results for orders placed or performed during the hospital encounter of 12/14/14  CSF culture  Result Value Ref Range   Specimen Description CSF    Special Requests Normal    Gram Stain      RARE WBC PRESENT, PREDOMINANTLY MONONUCLEAR NO ORGANISMS SEEN CYTOSPIN SMEAR Performed at [facility]    Culture PENDING    Report Status PENDING   CBC  Result Value Ref Range   WBC 6.7 4.0 - 10.5 K/uL   RBC 3.88 3.87 - 5.11 MIL/uL   Hemoglobin 13.0 12.0 - 15.0 g/dL   HCT 47.437.2 25.936.0 - 56.346.0 %   MCV 95.9 78.0 - 100.0 fL   MCH 33.5 26.0 - 34.0 pg   MCHC 34.9 30.0 - 36.0 g/dL   RDW 87.512.1 64.311.5 - 32.915.5 %   Platelets 211 150 - 400 K/uL  Basic metabolic panel  Result Value Ref Range   Sodium 135 135 - 145 mmol/L   Potassium 4.0 3.5 - 5.1 mmol/L   Chloride 101 101 - 111 mmol/L   CO2 25 22 - 32 mmol/L   Glucose, Bld 183 (H) 65 - 99 mg/dL   BUN 21 (H) 6 - 20 mg/dL   Creatinine, Ser 5.180.68 0.44 - 1.00 mg/dL   Calcium 9.8 8.9 - 84.110.3 mg/dL   GFR calc non Af Amer >60 >60 mL/min   GFR calc Af Amer >60 >60 mL/min   Anion gap 9 5 - 15  CSF cell count with differential collection tube #: 1  Result Value Ref Range   Tube # 1    Color, CSF COLORLESS COLORLESS   Appearance, CSF CLEAR CLEAR   Supernatant NOT INDICATED    RBC Count, CSF 72 (H) 0 /cu mm   WBC, CSF 3 0 - 5 /cu mm   Segmented Neutrophils-CSF NONE SEEN 0 - 6 %   Lymphs, CSF FEW 40 - 80 %   Monocyte-Macrophage-Spinal Fluid FEW 15 - 45 %   Eosinophils, CSF NONE SEEN 0 - 1 %  CSF cell count with differential collection tube #: 4  Result Value Ref Range   Tube # 4    Color, CSF COLORLESS COLORLESS   Appearance, CSF CLEAR CLEAR   Supernatant NOT INDICATED    RBC Count, CSF 5 (H) 0 /cu mm   WBC, CSF 1 0 - 5 /cu mm   Segmented Neutrophils-CSF NONE SEEN 0 - 6 %   Lymphs, CSF FEW 40 - 80 %   Monocyte-Macrophage-Spinal Fluid FEW 15 - 45 %   Eosinophils, CSF NONE SEEN 0 - 1 %  Protein, CSF  Result Value Ref Range   Total  Protein, CSF  69 (H) 15 - 45 mg/dL  Glucose, CSF  Result Value Ref Range   Glucose, CSF 103 (H) 40 - 70 mg/dL   Ct Angio Head W/cm &/or Wo Cm  12/14/2014   CLINICAL DATA:  64 year old female with right frontal region headache, right eye pain. Vomiting. Initial encounter.  EXAM: CT ANGIOGRAPHY HEAD AND NECK  TECHNIQUE: Multidetector CT imaging of the head and neck was performed using the standard protocol during bolus administration of intravenous contrast. Multiplanar CT image reconstructions and MIPs were obtained to evaluate the vascular anatomy. Carotid stenosis measurements (when applicable) are obtained utilizing NASCET criteria, using the distal internal carotid diameter as the denominator.  CONTRAST:  100mL OMNIPAQUE IOHEXOL 350 MG/ML SOLN  COMPARISON:  [facility] Brain MRI 12/07/2014, and earlier  FINDINGS: CT HEAD  Brain: No  ventriculomegaly. No midline shift, mass effect, or evidence of intracranial mass lesion. Scattered and patchy white matter hypodensity appears stable. No acute intracranial hemorrhage identified.  Calvarium and skull base: Hyperostosis of the calvarium. No acute osseous abnormality identified.  Paranasal sinuses: Visualized paranasal sinuses and mastoids are clear.  Orbits: Visualized orbits and scalp soft tissues are within normal limits.  CTA NECK  Skeleton: Degenerative changes in the cervical spine including mild anterolisthesis at C6-C7. Associated facet arthropathy. No acute osseous abnormality identified.  Other neck: Negative lung apices. Mediastinal lipomatosis. No superior mediastinal lymphadenopathy.  Thyroid, larynx, pharynx (retained secretions in the vallecula), parapharyngeal spaces, sublingual space, submandibular glands, and parotid glands are within normal limits.  Negative retropharyngeal space aside from a retropharyngeal course of the carotids, more so the right.  Right level 2A lymph node on series 8, image 74 is at the upper limits of normal, 8-9 mm short axis,  but other cervical lymph nodes are within normal limits, and overall there is no lymphadenopathy.  Aortic arch: 3 vessel arch configuration. Mild arch atherosclerosis.  Right carotid system: Negative right CCA. Retropharyngeal right carotid bifurcation and cervical right ICA course. Calcified and soft plaque at the right ICA origin and bulb resulting in less than 50 % stenosis with respect to the distal vessel (coronal image 113 series 10). Mildly tortuous cervical right ICA otherwise negative to the skullbase.  Left carotid system: Negative left CCA. Mild soft plaque at the left ICA origin and bulb, widely patent left carotid bifurcation. No cervical left ICA stenosis.  Vertebral arteries:No proximal right subclavian artery stenosis. Normal right vertebral artery origin. Negative right vertebral artery to the skullbase.  No proximal left subclavian artery stenosis. Normal left vertebral artery origin. The left vertebral artery is mildly dominant, and negative to the skullbase.  CTA HEAD  Posterior circulation: Mildly dominant distal left vertebral artery. Calcified plaque of the distal right vertebral artery without stenosis. Normal PICA origins. Patent vertebrobasilar junction. No basilar stenosis. Normal SCA (duplicated on the left) and PCA origins. Posterior communicating arteries are diminutive or absent. Bilateral PCA branches are within normal limits.  Anterior circulation: Mild calcified plaque of the right ICA siphon with no stenosis. Normal right ophthalmic artery origin. Mildly non dominant appearance of the left ICA siphon with minimal calcified plaque and no stenosis. Normal left ophthalmic artery origin. Patent carotid termini. The left ACA A1 segment is diminutive or absent. Normal MCA and right ACA origins. Left MCA branches are within normal limits. Right MCA branches are within normal limits.  Normal anterior communicating artery. Right ACA branches are within normal limits.  Venous sinuses: Patent   Anatomic variants: Diminutive or absent left ACA A1 segment resulting in a dominant right ICA siphon. Dominant left vertebral artery.  Delayed phase: No abnormal enhancement identified.  IMPRESSION: 1. Right greater than left ICA origin and bulb plaque but no hemodynamically significant stenosis, and otherwise negative CTA head and neck. 2. Stable CT appearance of the brain. 3. No acute findings identified in the neck.   Electronically Signed   By: Odessa FlemingH  Hall M.D.   On: 12/14/2014 13:22   Ct Head Wo Contrast  12/02/2014   CLINICAL DATA:  Severe headache, onset yesterday morning.  EXAM: CT HEAD WITHOUT CONTRAST  TECHNIQUE: Contiguous axial images were obtained from the base of the skull through the vertex without intravenous contrast.  COMPARISON:  None.  FINDINGS: Ventricles are normal in size and configuration. There are no parenchymal masses or mass effect. There  is no evidence of a cortical infarct.  There are patchy areas of white matter hypoattenuation consistent with moderate chronic microvascular ischemic change.  There are no extra-axial masses or abnormal fluid collections.  There is no intracranial hemorrhage.  Visualized sinuses and mastoid air cells are clear. No skull lesion.  IMPRESSION: 1. No acute intracranial abnormalities. 2. Moderate chronic microvascular ischemic change.   Electronically Signed   By: Amie Portland M.D.   On: 12/02/2014 10:46   Ct Angio Neck W/cm &/or Wo/cm  12/14/2014   CLINICAL DATA:  64 year old female with right frontal region headache, right eye pain. Vomiting. Initial encounter.  EXAM: CT ANGIOGRAPHY HEAD AND NECK  TECHNIQUE: Multidetector CT imaging of the head and neck was performed using the standard protocol during bolus administration of intravenous contrast. Multiplanar CT image reconstructions and MIPs were obtained to evaluate the vascular anatomy. Carotid stenosis measurements (when applicable) are obtained utilizing NASCET criteria, using the distal internal  carotid diameter as the denominator.  CONTRAST:  OMNIPAQUE IOHEXOL 350 MG/ML SOLN  COMPARISON:  [facility] Brain MRI 12/07/2014, and earlier  FINDINGS: CT HEAD  Brain: No ventriculomegaly. No midline shift, mass effect, or evidence of intracranial mass lesion. Scattered and patchy white matter hypodensity appears stable. No acute intracranial hemorrhage identified.  Calvarium and skull base: Hyperostosis of the calvarium. No acute osseous abnormality identified.  Paranasal sinuses: Visualized paranasal sinuses and mastoids are clear.  Orbits: Visualized orbits and scalp soft tissues are within normal limits.  CTA NECK  Skeleton: Degenerative changes in the cervical spine including mild anterolisthesis at C6-C7. Associated facet arthropathy. No acute osseous abnormality identified.  Other neck: Negative lung apices. Mediastinal lipomatosis. No superior mediastinal lymphadenopathy.  Thyroid, larynx, pharynx (retained secretions in the vallecula), parapharyngeal spaces, sublingual space, submandibular glands, and parotid glands are within normal limits.  Negative retropharyngeal space aside from a retropharyngeal course of the carotids, more so the right.  Right level 2A lymph node on series 8, image 74 is at the upper limits of normal, 8-9 mm short axis, but other cervical lymph nodes are within normal limits, and overall there is no lymphadenopathy.  Aortic arch: 3 vessel arch configuration. Mild arch atherosclerosis.  Right carotid system: Negative right CCA. Retropharyngeal right carotid bifurcation and cervical right ICA course. Calcified and soft plaque at the right ICA origin and bulb resulting in less than 50 % stenosis with respect to the distal vessel (coronal image 113 series 10). Mildly tortuous cervical right ICA otherwise negative to the skullbase.  Left carotid system: Negative left CCA. Mild soft plaque at the left ICA origin and bulb, widely patent left carotid bifurcation. No cervical  left ICA stenosis.  Vertebral arteries:No proximal right subclavian artery stenosis. Normal right vertebral artery origin. Negative right vertebral artery to the skullbase.  No proximal left subclavian artery stenosis. Normal left vertebral artery origin. The left vertebral artery is mildly dominant, and negative to the skullbase.  CTA HEAD  Posterior circulation: Mildly dominant distal left vertebral artery. Calcified plaque of the distal right vertebral artery without stenosis. Normal PICA origins. Patent vertebrobasilar junction. No basilar stenosis. Normal SCA (duplicated on the left) and PCA origins. Posterior communicating arteries are diminutive or absent. Bilateral PCA branches are within normal limits.  Anterior circulation: Mild calcified plaque of the right ICA siphon with no stenosis. Normal right ophthalmic artery origin. Mildly non dominant appearance of the left ICA siphon with minimal calcified plaque and no stenosis. Normal left ophthalmic artery origin. Patent  carotid termini. The left ACA A1 segment is diminutive or absent. Normal MCA and right ACA origins. Left MCA branches are within normal limits. Right MCA branches are within normal limits.  Normal anterior communicating artery. Right ACA branches are within normal limits.  Venous sinuses: Patent  Anatomic variants: Diminutive or absent left ACA A1 segment resulting in a dominant right ICA siphon. Dominant left vertebral artery.  Delayed phase: No abnormal enhancement identified.  IMPRESSION: 1. Right greater than left ICA origin and bulb plaque but no hemodynamically significant stenosis, and otherwise negative CTA head and neck. 2. Stable CT appearance of the brain. 3. No acute findings identified in the neck.   Electronically Signed   By: Odessa Fleming M.D.   On: 12/14/2014 13:22   Mr Brain Wo Contrast  12/07/2014   CLINICAL DATA:  64 year old female with headaches greater on the left side for will several days. Confusion and ataxia. Initial  encounter.  EXAM: MRI HEAD WITHOUT CONTRAST  TECHNIQUE: Multiplanar, multiecho pulse sequences of the brain and surrounding structures were obtained without intravenous contrast.  COMPARISON:  Head CT without contrast 12/02/2014.  FINDINGS: Cerebral volume is within normal limits for age. No restricted diffusion to suggest acute infarction. No midline shift, mass effect, evidence of mass lesion, ventriculomegaly, extra-axial collection or acute intracranial hemorrhage. Cervicomedullary junction and pituitary are within normal limits. Major intracranial vascular flow voids are within normal limits.  Patchy and confluent bilateral cerebral white matter T2 and FLAIR hyperintensity. No cortical encephalomalacia or chronic blood products identified. Similar moderate, confluent T2 hyperintensity in the pons. The deep gray matter nuclei largely ear spared (mild T2 heterogeneity in the right basal ganglia). The cerebellum is within normal limits for age. Negative visualized cervical spine.  Visible internal auditory structures appear normal. Mastoids are clear. Paranasal sinuses are clear. Visualized orbit soft tissues are within normal limits. Visualized scalp soft tissues are within normal limits. Normal bone marrow signal.  IMPRESSION: 1.  No acute intracranial abnormality. 2. Moderately advanced nonspecific signal changes, most commonly due to chronic small vessel disease. Other top differential considerations include hypercoagulable state, vasculitis, prior infection, or demyelination.   Electronically Signed   By: Odessa Fleming M.D.   On: 12/07/2014 20:50    Results the patient's of lumbar puncture without evidence of any acute findings. Patient has follow-up with her regular doctor. Headache has improved.  Vanetta Mulders, MD 12/14/14 1710

## 2014-12-14 NOTE — ED Notes (Signed)
Headache since 0900- sudden onset- vomited x 4- reports today is 7th headache in last 2 weeks- had recent CT and MRi for evaluation of headaches- hx of mha 8 years ago- grips equal, speech clear, no drift

## 2014-12-14 NOTE — ED Notes (Signed)
MD at bedside. 

## 2014-12-14 NOTE — ED Notes (Signed)
Patient transported to CT 

## 2014-12-14 NOTE — ED Notes (Addendum)
Dr Gwendolyn GrantWalden updated on pt status and at bedside to assess. Pt brought in typed note from her co-workers describing recent events- placed on chart to be scanned into medical record

## 2014-12-14 NOTE — ED Provider Notes (Signed)
CSN: [id number]     Arrival date & time 12/14/14  1034 History   First MD Initiated Contact with Patient 12/14/14 1051     Chief Complaint  Patient presents with  . Headache     (Consider location/radiation/quality/duration/timing/severity/associated sxs/prior Treatment) Patient is a 64 y.o. female presenting with headaches. The history is provided by the patient.  Headache Pain location:  Frontal Quality:  Sharp Radiates to:  Does not radiate Severity currently:  10/10 Onset quality:  Sudden Duration:  2 hours Timing:  Constant Progression:  Unchanged Chronicity:  Recurrent Similar to prior headaches: yes   Context comment:  At rest Relieved by:  Nothing Worsened by:  Light Ineffective treatments:  None tried Associated symptoms: visual change (saw shadows in periphery of L eye)   Associated symptoms: no abdominal pain, no cough and no fever     Past Medical History  Diagnosis Date  . Hypertension   . Diabetes mellitus   . Anxiety   . Depression   . Asthma     no inhaler use  . OSA (obstructive sleep apnea)     uses a CPAP  . Dyslipidemia   . Depression   . Anxiety   . RLS (restless legs syndrome)   . Rosacea   . Pneumonia   . Bronchitis   . GERD (gastroesophageal reflux disease)   . Dysrhythmia 2008    tachycardia/SVT  . Difficult intubation     previous intubations in 2012 with 7.0ETT using MAC 3 or 4; on 11/24/13 unable to intubate using Hyacinth MeekerMiller 3 followed [facility]   Past Surgical History  Procedure Laterality Date  . Cardiac electrophysiology mapping and ablation      [facility]  . Dilation and curettage of uterus    . Shoulder arthroscopy Left 2012     x 2  . Hysteroscopy w/d&c  03/04/2012    Procedure: DILATATION AND CURETTAGE /HYSTEROSCOPY;  Surgeon: Dorien Chihuahuaara J. Richardson Doppole, MD;  Location: [facility];  Service: Gynecology;  Laterality: N/A;  . Cesarean section    . Carpal tunnel release Bilateral   . Tonsillectomy    .  Cholecystectomy    . Shoulder arthroscopy with rotator cuff repair Right 12/01/2013    Procedure: RIGHT SHOULDER ARTHROSCOPY WITH ROTATOR CUFF REPAIR;  Surgeon: Velna OchsPeter G Dalldorf, MD;  Location: [facility] OR;  Service: Orthopedics;  Laterality: Right;   Family History  Problem Relation Age of Onset  . Breast cancer Mother   . Emphysema Mother     was a smoker  . Cancer Mother   . Heart disease Mother   . Hyperlipidemia Mother    History  Substance Use Topics  . Smoking status: Never Smoker   . Smokeless tobacco: Never Used  . Alcohol Use: 3.5 oz/week    7 drink(s) per week     Comment: weekly   OB History    No data available     Review of Systems  Constitutional: Negative for fever.  Respiratory: Negative for cough and shortness of breath.   Gastrointestinal: Negative for abdominal pain.  Neurological: Positive for headaches.  All other systems reviewed and are negative.     Allergies  Prevacid  Home Medications   Prior to Admission medications   Medication Sig Start Date End Date Taking? Authorizing Provider  aspirin EC 81 MG tablet Take 81 mg by mouth daily.    Historical Provider, MD  escitalopram (LEXAPRO) 20 MG tablet Take 20 mg  by mouth daily.    Historical Provider, MD  gabapentin (NEURONTIN) 300 MG capsule Take 1 capsule (300 mg total) by mouth 3 (three) times daily. 07/13/13   Nyoka Cowden, MD  lidocaine (LIDODERM) 5 % Place 1 patch onto the skin daily. Remove & Discard patch within 12 hours or as directed by MD    Historical Provider, MD  MAGNESIUM PO Take 1,300 mg by mouth daily.    Historical Provider, MD  metFORMIN (GLUCOPHAGE) 500 MG tablet Take 500 mg by mouth 2 (two) times daily with a meal.    Historical Provider, MD  Methylcobalamin (METHYL B-12 PO) Take 1 tablet by mouth daily.    Historical Provider, MD  metoprolol succinate (TOPROL-XL) 25 MG 24 hr tablet Take 12.5 mg by mouth 2 (two) times daily.    Historical Provider, MD   olmesartan-hydrochlorothiazide (BENICAR HCT) 40-25 MG per tablet Take 0.5 tablets by mouth daily.    Historical Provider, MD  OVER THE COUNTER MEDICATION Take 400 mg by mouth daily. Super Bio-Curcumin    Historical Provider, MD  oxyCODONE-acetaminophen (ROXICET) 5-325 MG per tablet Take 1-2 tablets by mouth every 6 (six) hours as needed for severe pain. Patient not taking: Reported on 12/02/2014 12/01/13   Elodia Florence, PA-C  simvastatin (ZOCOR) 20 MG tablet Take 20 mg by mouth every evening.    Historical Provider, MD  SUMAtriptan (IMITREX) 20 MG/ACT nasal spray Place 1 spray (20 mg total) into the nose every 2 (two) hours as needed for migraine or headache. May repeat in 2 hours if headache persists or recurs. 12/02/14   Todd McVeigh, PA  Ubiquinol 100 MG CAPS Take 100 mg by mouth daily.    Historical Provider, MD  VITAMIN D-VITAMIN K PO Take 1 tablet by mouth daily.    Historical Provider, MD  zolpidem (AMBIEN) 10 MG tablet Take 10 mg by mouth at bedtime.    Historical Provider, MD   BP 183/71 mmHg  Pulse 69  Temp(Src) 97.6 F (36.4 C) (Oral)  Resp 20  Ht  (1.626 m)  Wt 220 lb (99.791 kg)  BMI 37.74 kg/m2  SpO2 97%  LMP 03/03/2012 Physical Exam  Constitutional: She is oriented to person, place, and time. She appears well-developed and well-nourished. No distress.  HENT:  Head: Normocephalic and atraumatic.  Mouth/Throat: Oropharynx is clear and moist.  Eyes: EOM are normal. Pupils are equal, round, and reactive to light.  Neck: Normal range of motion. Neck supple.  Cardiovascular: Normal rate and regular rhythm.  Exam reveals no friction rub.   No murmur heard. Pulmonary/Chest: Effort normal and breath sounds normal. No respiratory distress. She has no wheezes. She has no rales.  Abdominal: Soft. She exhibits no distension. There is no tenderness. There is no rebound.  Musculoskeletal: Normal range of motion. She exhibits no edema.  Neurological: She is alert and oriented to  person, place, and time. No cranial nerve deficit. She exhibits normal muscle tone. Coordination normal.  No visual field cuts  Skin: Skin is warm. No rash noted. She is not diaphoretic.  Nursing note and vitals reviewed.   ED Course  LUMBAR PUNCTURE Date/Time: 12/14/2014 3:21 PM Performed by: Elwin Mocha Authorized by: Elwin Mocha Consent: Verbal consent obtained. Time out: Immediately prior to procedure a "time out" was called to verify the correct patient, procedure, equipment, support staff and site/side marked as required. Indications: evaluation for subarachnoid hemorrhage Anesthesia: local infiltration Local anesthetic: lidocaine 1% without epinephrine Anesthetic total: 3 ml Patient  sedated: no Preparation: Patient was prepped and draped in the usual sterile fashion. Lumbar space: L4-L5 interspace Patient's position: sitting Needle gauge: 22 Needle type: spinal needle - Quincke tip Needle length: 3.5 in Number of attempts: 1 Fluid appearance: clear Tubes of fluid: 4 Total volume: 4 ml Post-procedure: site cleaned and adhesive bandage applied Patient tolerance: Patient tolerated the procedure well with no immediate complications   (including critical care time) Labs Review Labs Reviewed  CBC  BASIC METABOLIC PANEL    Imaging Review Ct Angio Head W/cm &/or Wo Cm  12/14/2014   CLINICAL DATA:  64 year old female with right frontal region headache, right eye pain. Vomiting. Initial encounter.  EXAM: CT ANGIOGRAPHY HEAD AND NECK  TECHNIQUE: Multidetector CT imaging of the head and neck was performed using the standard protocol during bolus administration of intravenous contrast. Multiplanar CT image reconstructions and MIPs were obtained to evaluate the vascular anatomy. Carotid stenosis measurements (when applicable) are obtained utilizing NASCET criteria, using the distal internal carotid diameter as the denominator.  CONTRAST:  OMNIPAQUE IOHEXOL 350 MG/ML SOLN   COMPARISON:  [facility] Brain MRI 12/07/2014, and earlier  FINDINGS: CT HEAD  Brain: No ventriculomegaly. No midline shift, mass effect, or evidence of intracranial mass lesion. Scattered and patchy white matter hypodensity appears stable. No acute intracranial hemorrhage identified.  Calvarium and skull base: Hyperostosis of the calvarium. No acute osseous abnormality identified.  Paranasal sinuses: Visualized paranasal sinuses and mastoids are clear.  Orbits: Visualized orbits and scalp soft tissues are within normal limits.  CTA NECK  Skeleton: Degenerative changes in the cervical spine including mild anterolisthesis at C6-C7. Associated facet arthropathy. No acute osseous abnormality identified.  Other neck: Negative lung apices. Mediastinal lipomatosis. No superior mediastinal lymphadenopathy.  Thyroid, larynx, pharynx (retained secretions in the vallecula), parapharyngeal spaces, sublingual space, submandibular glands, and parotid glands are within normal limits.  Negative retropharyngeal space aside from a retropharyngeal course of the carotids, more so the right.  Right level 2A lymph node on series 8, image 74 is at the upper limits of normal, 8-9 mm short axis, but other cervical lymph nodes are within normal limits, and overall there is no lymphadenopathy.  Aortic arch: 3 vessel arch configuration. Mild arch atherosclerosis.  Right carotid system: Negative right CCA. Retropharyngeal right carotid bifurcation and cervical right ICA course. Calcified and soft plaque at the right ICA origin and bulb resulting in less than 50 % stenosis with respect to the distal vessel (coronal image 113 series 10). Mildly tortuous cervical right ICA otherwise negative to the skullbase.  Left carotid system: Negative left CCA. Mild soft plaque at the left ICA origin and bulb, widely patent left carotid bifurcation. No cervical left ICA stenosis.  Vertebral arteries:No proximal right subclavian artery stenosis. Normal  right vertebral artery origin. Negative right vertebral artery to the skullbase.  No proximal left subclavian artery stenosis. Normal left vertebral artery origin. The left vertebral artery is mildly dominant, and negative to the skullbase.  CTA HEAD  Posterior circulation: Mildly dominant distal left vertebral artery. Calcified plaque of the distal right vertebral artery without stenosis. Normal PICA origins. Patent vertebrobasilar junction. No basilar stenosis. Normal SCA (duplicated on the left) and PCA origins. Posterior communicating arteries are diminutive or absent. Bilateral PCA branches are within normal limits.  Anterior circulation: Mild calcified plaque of the right ICA siphon with no stenosis. Normal right ophthalmic artery origin. Mildly non dominant appearance of the left ICA siphon with minimal calcified plaque and no  stenosis. Normal left ophthalmic artery origin. Patent carotid termini. The left ACA A1 segment is diminutive or absent. Normal MCA and right ACA origins. Left MCA branches are within normal limits. Right MCA branches are within normal limits.  Normal anterior communicating artery. Right ACA branches are within normal limits.  Venous sinuses: Patent  Anatomic variants: Diminutive or absent left ACA A1 segment resulting in a dominant right ICA siphon. Dominant left vertebral artery.  Delayed phase: No abnormal enhancement identified.  IMPRESSION: 1. Right greater than left ICA origin and bulb plaque but no hemodynamically significant stenosis, and otherwise negative CTA head and neck. 2. Stable CT appearance of the brain. 3. No acute findings identified in the neck.   Electronically Signed   By: Odessa FlemingH  Hall M.D.   On: 12/14/2014 13:22   Ct Angio Neck W/cm &/or Wo/cm  12/14/2014   CLINICAL DATA:  64 year old female with right frontal region headache, right eye pain. Vomiting. Initial encounter.  EXAM: CT ANGIOGRAPHY HEAD AND NECK  TECHNIQUE: Multidetector CT imaging of the head and neck  was performed using the standard protocol during bolus administration of intravenous contrast. Multiplanar CT image reconstructions and MIPs were obtained to evaluate the vascular anatomy. Carotid stenosis measurements (when applicable) are obtained utilizing NASCET criteria, using the distal internal carotid diameter as the denominator.  CONTRAST:  100mL OMNIPAQUE IOHEXOL 350 MG/ML SOLN  COMPARISON:  [facility] Brain MRI 12/07/2014, and earlier  FINDINGS: CT HEAD  Brain: No ventriculomegaly. No midline shift, mass effect, or evidence of intracranial mass lesion. Scattered and patchy white matter hypodensity appears stable. No acute intracranial hemorrhage identified.  Calvarium and skull base: Hyperostosis of the calvarium. No acute osseous abnormality identified.  Paranasal sinuses: Visualized paranasal sinuses and mastoids are clear.  Orbits: Visualized orbits and scalp soft tissues are within normal limits.  CTA NECK  Skeleton: Degenerative changes in the cervical spine including mild anterolisthesis at C6-C7. Associated facet arthropathy. No acute osseous abnormality identified.  Other neck: Negative lung apices. Mediastinal lipomatosis. No superior mediastinal lymphadenopathy.  Thyroid, larynx, pharynx (retained secretions in the vallecula), parapharyngeal spaces, sublingual space, submandibular glands, and parotid glands are within normal limits.  Negative retropharyngeal space aside from a retropharyngeal course of the carotids, more so the right.  Right level 2A lymph node on series 8, image 74 is at the upper limits of normal, 8-9 mm short axis, but other cervical lymph nodes are within normal limits, and overall there is no lymphadenopathy.  Aortic arch: 3 vessel arch configuration. Mild arch atherosclerosis.  Right carotid system: Negative right CCA. Retropharyngeal right carotid bifurcation and cervical right ICA course. Calcified and soft plaque at the right ICA origin and bulb resulting in  less than 50 % stenosis with respect to the distal vessel (coronal image 113 series 10). Mildly tortuous cervical right ICA otherwise negative to the skullbase.  Left carotid system: Negative left CCA. Mild soft plaque at the left ICA origin and bulb, widely patent left carotid bifurcation. No cervical left ICA stenosis.  Vertebral arteries:No proximal right subclavian artery stenosis. Normal right vertebral artery origin. Negative right vertebral artery to the skullbase.  No proximal left subclavian artery stenosis. Normal left vertebral artery origin. The left vertebral artery is mildly dominant, and negative to the skullbase.  CTA HEAD  Posterior circulation: Mildly dominant distal left vertebral artery. Calcified plaque of the distal right vertebral artery without stenosis. Normal PICA origins. Patent vertebrobasilar junction. No basilar stenosis. Normal SCA (duplicated on the left) and  PCA origins. Posterior communicating arteries are diminutive or absent. Bilateral PCA branches are within normal limits.  Anterior circulation: Mild calcified plaque of the right ICA siphon with no stenosis. Normal right ophthalmic artery origin. Mildly non dominant appearance of the left ICA siphon with minimal calcified plaque and no stenosis. Normal left ophthalmic artery origin. Patent carotid termini. The left ACA A1 segment is diminutive or absent. Normal MCA and right ACA origins. Left MCA branches are within normal limits. Right MCA branches are within normal limits.  Normal anterior communicating artery. Right ACA branches are within normal limits.  Venous sinuses: Patent  Anatomic variants: Diminutive or absent left ACA A1 segment resulting in a dominant right ICA siphon. Dominant left vertebral artery.  Delayed phase: No abnormal enhancement identified.  IMPRESSION: 1. Right greater than left ICA origin and bulb plaque but no hemodynamically significant stenosis, and otherwise negative CTA head and neck. 2. Stable CT  appearance of the brain. 3. No acute findings identified in the neck.   Electronically Signed   By: Odessa Fleming M.D.   On: 12/14/2014 13:22     EKG Interpretation None      MDM   Final diagnoses:  Headache  Headache    64 year old female here with sudden onset headache. Began this morning. States it's, "worst headache of my life." She's had multiple headaches over the past month with imaging and visits to neurologist. She's had a CT and MRI buttock, both of which without contrast, both of which were negative. Vitals stable here. Associated nausea and vomiting. She initially had some left eye peripheral vision changes. Here exam is benign. She has no peripheral vision field cuts. We'll do CT without contrast and then CT angiogram to look for possible aneurysm. Also check the pressure in her eyes. IOP 17 on left, 18 on right. No concern for glaucoma. CTs negative.  LP performed to r/o bleed. No xanthochromia present. Will await fluid studies. Dr. Deretha Emory to f/u on fluid.  I have reviewed all labs and imaging and considered them in my medical decision making.   Elwin Mocha, MD 12/14/14 [phone]

## 2014-12-17 LAB — CSF CULTURE: Culture: NO GROWTH

## 2014-12-17 LAB — CSF CULTURE W GRAM STAIN: Special Requests: NORMAL

## 2015-05-07 ENCOUNTER — Emergency Department (HOSPITAL_BASED_OUTPATIENT_CLINIC_OR_DEPARTMENT_OTHER)
Admission: EM | Admit: 2015-05-07 | Discharge: 2015-05-07 | Disposition: A | Discharge status: Home / Self Care | Payer: BLUE CROSS/BLUE SHIELD | Attending: Emergency Medicine | Admitting: Emergency Medicine

## 2015-05-07 ENCOUNTER — Encounter (HOSPITAL_BASED_OUTPATIENT_CLINIC_OR_DEPARTMENT_OTHER): Payer: Self-pay | Admitting: *Deleted

## 2015-05-07 DIAGNOSIS — J45909 Unspecified asthma, uncomplicated: Secondary | ICD-10-CM | POA: Insufficient documentation

## 2015-05-07 DIAGNOSIS — Z79899 Other long term (current) drug therapy: Secondary | ICD-10-CM | POA: Insufficient documentation

## 2015-05-07 DIAGNOSIS — Z8701 Personal history of pneumonia (recurrent): Secondary | ICD-10-CM | POA: Insufficient documentation

## 2015-05-07 DIAGNOSIS — F419 Anxiety disorder, unspecified: Secondary | ICD-10-CM | POA: Insufficient documentation

## 2015-05-07 DIAGNOSIS — E119 Type 2 diabetes mellitus without complications: Secondary | ICD-10-CM | POA: Insufficient documentation

## 2015-05-07 DIAGNOSIS — G43909 Migraine, unspecified, not intractable, without status migrainosus: Secondary | ICD-10-CM | POA: Insufficient documentation

## 2015-05-07 DIAGNOSIS — G4733 Obstructive sleep apnea (adult) (pediatric): Secondary | ICD-10-CM | POA: Diagnosis not present

## 2015-05-07 DIAGNOSIS — Z9981 Dependence on supplemental oxygen: Secondary | ICD-10-CM | POA: Diagnosis not present

## 2015-05-07 DIAGNOSIS — F329 Major depressive disorder, single episode, unspecified: Secondary | ICD-10-CM | POA: Insufficient documentation

## 2015-05-07 DIAGNOSIS — R51 Headache: Secondary | ICD-10-CM | POA: Diagnosis present

## 2015-05-07 DIAGNOSIS — E785 Hyperlipidemia, unspecified: Secondary | ICD-10-CM | POA: Diagnosis not present

## 2015-05-07 DIAGNOSIS — G43009 Migraine without aura, not intractable, without status migrainosus: Secondary | ICD-10-CM

## 2015-05-07 DIAGNOSIS — Z872 Personal history of diseases of the skin and subcutaneous tissue: Secondary | ICD-10-CM | POA: Insufficient documentation

## 2015-05-07 DIAGNOSIS — Z8719 Personal history of other diseases of the digestive system: Secondary | ICD-10-CM | POA: Insufficient documentation

## 2015-05-07 DIAGNOSIS — Z7982 Long term (current) use of aspirin: Secondary | ICD-10-CM | POA: Insufficient documentation

## 2015-05-07 DIAGNOSIS — I10 Essential (primary) hypertension: Secondary | ICD-10-CM | POA: Insufficient documentation

## 2015-05-07 MED ORDER — SODIUM CHLORIDE 0.9 % IV BOLUS (SEPSIS)
1000.0000 mL | Freq: Once | INTRAVENOUS | Status: AC
  Administered 2015-05-07: 1000 mL via INTRAVENOUS

## 2015-05-07 MED ORDER — METOCLOPRAMIDE HCL 5 MG/ML IJ SOLN
10.0000 mg | Freq: Once | INTRAMUSCULAR | Status: AC
  Administered 2015-05-07: 10 mg via INTRAVENOUS
  Filled 2015-05-07: qty 2

## 2015-05-07 MED ORDER — DIPHENHYDRAMINE HCL 50 MG/ML IJ SOLN
25.0000 mg | Freq: Once | INTRAMUSCULAR | Status: AC
  Administered 2015-05-07: 25 mg via INTRAVENOUS
  Filled 2015-05-07: qty 1

## 2015-05-07 MED ORDER — KETOROLAC TROMETHAMINE 30 MG/ML IJ SOLN
30.0000 mg | Freq: Once | INTRAMUSCULAR | Status: AC
  Administered 2015-05-07: 30 mg via INTRAVENOUS
  Filled 2015-05-07: qty 1

## 2015-05-07 NOTE — ED Provider Notes (Signed)
TIME SEEN: 3:40 AM  CHIEF COMPLAINT: Migraine headache  HPI: Patient is a 64 year old female with history of hypertension, diabetes, previous complicated migraines who presents to the emergency department with complaints of a throbbing left-sided headache that started around 12 AM. States this feels similar to her prior migraines. Does have associated photophobia, nausea. Is on Plavix. No recent head injury. No numbness, tingling or focal weakness. Reports that she has had "TIAs" in the past with her migraines. States she normally gets slurred speech and has difficulty with her memory but has not had this today. No focal neurologic deficit currently. Tried Relpax at home without relief. States she's had to come to the hospital before for "migraine cocktails". Is followed by neurologist. Patient was seen in the hospital in May 2016 and had negative MRI, negative CTA of her head and a negative lumbar puncture. States she's been treated at [facility] and [facility] for her migraine headaches.  ROS: See HPI Constitutional: no fever  Eyes: no drainage  ENT: no runny nose   Cardiovascular:  no chest pain  Resp: no SOB  GI: no vomiting GU: no dysuria Integumentary: no rash  Allergy: no hives  Musculoskeletal: no leg swelling  Neurological: no slurred speech ROS otherwise negative  PAST MEDICAL HISTORY/PAST SURGICAL HISTORY:  Past Medical History  Diagnosis Date  . Hypertension   . Diabetes mellitus   . Anxiety   . Depression   . Asthma     no inhaler use  . OSA (obstructive sleep apnea)     uses a CPAP  . Dyslipidemia   . Depression   . Anxiety   . RLS (restless legs syndrome)   . Rosacea   . Pneumonia   . Bronchitis   . GERD (gastroesophageal reflux disease)   . Dysrhythmia 2008    tachycardia/SVT  . Difficult intubation     previous intubations in 2012 with 7.0ETT using MAC 3 or 4; on 11/24/13 unable to intubate using Hyacinth Meeker 3 followed by glidescope  attempt at [facility]    MEDICATIONS:  Prior to Admission medications   Medication Sig Start Date End Date Taking? Authorizing Provider  aspirin EC 81 MG tablet Take 81 mg by mouth daily.    Historical Provider, MD  escitalopram (LEXAPRO) 20 MG tablet Take 20 mg by mouth daily.    Historical Provider, MD  gabapentin (NEURONTIN) 300 MG capsule Take 1 capsule (300 mg total) by mouth 3 (three) times daily. 07/13/13   Nyoka Cowden, MD  lidocaine (LIDODERM) 5 % Place 1 patch onto the skin daily. Remove & Discard patch within 12 hours or as directed by MD    Historical Provider, MD  MAGNESIUM PO Take 1,300 mg by mouth daily.    Historical Provider, MD  metFORMIN (GLUCOPHAGE) 500 MG tablet Take 500 mg by mouth 2 (two) times daily with a meal.    Historical Provider, MD  Methylcobalamin (METHYL B-12 PO) Take 1 tablet by mouth daily.    Historical Provider, MD  metoprolol succinate (TOPROL-XL) 25 MG 24 hr tablet Take 12.5 mg by mouth 2 (two) times daily.    Historical Provider, MD  olmesartan-hydrochlorothiazide (BENICAR HCT) 40-25 MG per tablet Take 0.5 tablets by mouth daily.    Historical Provider, MD  OVER THE COUNTER MEDICATION Take 400 mg by mouth daily. Super Bio-Curcumin    Historical Provider, MD  oxyCODONE-acetaminophen (ROXICET) 5-325 MG per tablet Take 1-2 tablets by mouth every 6 (six)  hours as needed for severe pain. Patient not taking: Reported on 12/02/2014 12/01/13   Elodia Florence, PA-C  simvastatin (ZOCOR) 20 MG tablet Take 20 mg by mouth every evening.    Historical Provider, MD  SUMAtriptan (IMITREX) 20 MG/ACT nasal spray Place 1 spray (20 mg total) into the nose every 2 (two) hours as needed for migraine or headache. May repeat in 2 hours if headache persists or recurs. 12/02/14   Todd McVeigh, PA  Ubiquinol 100 MG CAPS Take 100 mg by mouth daily.    Historical Provider, MD  VITAMIN D-VITAMIN K PO Take 1 tablet by mouth daily.    Historical Provider, MD  zolpidem (AMBIEN) 10 MG  tablet Take 10 mg by mouth at bedtime.    Historical Provider, MD    ALLERGIES:  Allergies  Allergen Reactions  . Prevacid [Lansoprazole] Hives    SOCIAL HISTORY:  Social History  Substance Use Topics  . Smoking status: Never Smoker   . Smokeless tobacco: Never Used  . Alcohol Use: 3.5 oz/week    7 drink(s) per week     Comment: weekly    FAMILY HISTORY: Family History  Problem Relation Age of Onset  . Breast cancer Mother   . Emphysema Mother     was a smoker  . Cancer Mother   . Heart disease Mother   . Hyperlipidemia Mother     EXAM: BP 168/90 mmHg  Pulse 75  Temp(Src) 97.8 F (36.6 C) (Oral)  Resp 20  Ht  (1.626 m)  Wt 220 lb (99.791 kg)  BMI 37.74 kg/m2  SpO2 94%  LMP 03/03/2012 CONSTITUTIONAL: Alert and oriented and responds appropriately to questions. Well-appearing; well-nourished, appears uncomfortable but is afebrile HEAD: Normocephalic EYES: Conjunctivae clear, PERRL, has photophobia ENT: normal nose; no rhinorrhea; moist mucous membranes; pharynx without lesions noted NECK: Supple, no meningismus, no LAD  CARD: RRR; S1 and S2 appreciated; no murmurs, no clicks, no rubs, no gallops RESP: Normal chest excursion without splinting or tachypnea; breath sounds clear and equal bilaterally; no wheezes, no rhonchi, no rales, no hypoxia or respiratory distress, speaking full sentences ABD/GI: Normal bowel sounds; non-distended; soft, non-tender, no rebound, no guarding, no peritoneal signs BACK:  The back appears normal and is non-tender to palpation, there is no CVA tenderness EXT: Normal ROM in all joints; non-tender to palpation; no edema; normal capillary refill; no cyanosis, no calf tenderness or swelling    SKIN: Normal color for age and race; warm NEURO: Moves all extremities equally, sensation to light touch intact diffusely, cranial nerves II through XII intact PSYCH: The patient's mood and manner are appropriate. Grooming and personal hygiene are  appropriate.  MEDICAL DECISION MAKING: Patient here with migraine headache. Has had similar headaches in the past. Denies that this is sudden onset, thunderclap or the worst take of her life.States she has had TIAs in the past with her migraines but they may have been complicated migraine headaches.  No focal neurologic deficits today. Patient is on Plavix.  Discussed with patient that I do not feel she needs imaging of her head and she agrees given her normal neurologic exam and given that this feels similar to her prior chronic headaches. States that "migraine cocktail" has helped with her pain in the past. We'll give Toradol, Reglan, IV fluids, Benadryl and reassess.  ED PROGRESS: 5:20 AM  Pt reports that her headache is almost completely gone. She states she is feeling "much better". She does not need anything else for  pain. She states she will call someone to pick her up from the emergency department. Have recommended follow-up with her neurologist. Discussed return precautions. She verbalized understanding and is comfortable with this plan.     Layla Maw Ward, DO 05/07/15 [phone]

## 2015-05-07 NOTE — Discharge Instructions (Signed)

## 2015-05-07 NOTE — ED Notes (Signed)
C/o HA, onset ~ 2hrs ago, h/o same, "the last time I had a TIA with it", rates 9/10, took relpax and advil PTA w/o relief, also mentions some nausea (denies: vd, fever, LOC, other pain, dizziness, visual changes or other sx), pt of Drs. Donette Larry (PCP) and Helane Gunther (neuro). Alert, NAD, calm, interactive, speech clear.

## 2015-05-07 NOTE — ED Notes (Signed)
Dr. Ward in to see pt.  

## 2016-03-02 ENCOUNTER — Encounter (HOSPITAL_COMMUNITY): Payer: Self-pay | Admitting: *Deleted

## 2016-03-02 ENCOUNTER — Emergency Department (HOSPITAL_COMMUNITY): Payer: PPO

## 2016-03-02 ENCOUNTER — Observation Stay (HOSPITAL_COMMUNITY)
Admission: EM | Admit: 2016-03-02 | Discharge: 2016-03-03 | Disposition: A | Discharge status: Home / Self Care | Payer: PPO | Attending: Internal Medicine | Admitting: Internal Medicine

## 2016-03-02 ENCOUNTER — Observation Stay (HOSPITAL_COMMUNITY): Payer: PPO

## 2016-03-02 DIAGNOSIS — Z8673 Personal history of transient ischemic attack (TIA), and cerebral infarction without residual deficits: Secondary | ICD-10-CM | POA: Insufficient documentation

## 2016-03-02 DIAGNOSIS — F329 Major depressive disorder, single episode, unspecified: Secondary | ICD-10-CM | POA: Insufficient documentation

## 2016-03-02 DIAGNOSIS — R262 Difficulty in walking, not elsewhere classified: Secondary | ICD-10-CM | POA: Diagnosis not present

## 2016-03-02 DIAGNOSIS — E1140 Type 2 diabetes mellitus with diabetic neuropathy, unspecified: Secondary | ICD-10-CM | POA: Diagnosis not present

## 2016-03-02 DIAGNOSIS — R4781 Slurred speech: Secondary | ICD-10-CM | POA: Diagnosis not present

## 2016-03-02 DIAGNOSIS — R531 Weakness: Secondary | ICD-10-CM | POA: Diagnosis not present

## 2016-03-02 DIAGNOSIS — F419 Anxiety disorder, unspecified: Secondary | ICD-10-CM | POA: Diagnosis not present

## 2016-03-02 DIAGNOSIS — G44009 Cluster headache syndrome, unspecified, not intractable: Secondary | ICD-10-CM | POA: Diagnosis present

## 2016-03-02 DIAGNOSIS — I499 Cardiac arrhythmia, unspecified: Secondary | ICD-10-CM | POA: Diagnosis present

## 2016-03-02 DIAGNOSIS — G459 Transient cerebral ischemic attack, unspecified: Principal | ICD-10-CM | POA: Diagnosis present

## 2016-03-02 DIAGNOSIS — K118 Other diseases of salivary glands: Secondary | ICD-10-CM | POA: Diagnosis not present

## 2016-03-02 DIAGNOSIS — I471 Supraventricular tachycardia: Secondary | ICD-10-CM

## 2016-03-02 DIAGNOSIS — G458 Other transient cerebral ischemic attacks and related syndromes: Secondary | ICD-10-CM

## 2016-03-02 DIAGNOSIS — Z79899 Other long term (current) drug therapy: Secondary | ICD-10-CM | POA: Insufficient documentation

## 2016-03-02 DIAGNOSIS — G629 Polyneuropathy, unspecified: Secondary | ICD-10-CM

## 2016-03-02 DIAGNOSIS — R030 Elevated blood-pressure reading, without diagnosis of hypertension: Secondary | ICD-10-CM | POA: Diagnosis not present

## 2016-03-02 DIAGNOSIS — R05 Cough: Secondary | ICD-10-CM | POA: Diagnosis present

## 2016-03-02 DIAGNOSIS — Z7982 Long term (current) use of aspirin: Secondary | ICD-10-CM | POA: Insufficient documentation

## 2016-03-02 DIAGNOSIS — E119 Type 2 diabetes mellitus without complications: Secondary | ICD-10-CM

## 2016-03-02 DIAGNOSIS — R29810 Facial weakness: Secondary | ICD-10-CM | POA: Diagnosis not present

## 2016-03-02 DIAGNOSIS — Z7984 Long term (current) use of oral hypoglycemic drugs: Secondary | ICD-10-CM | POA: Diagnosis not present

## 2016-03-02 DIAGNOSIS — Z6835 Body mass index (BMI) 35.0-35.9, adult: Secondary | ICD-10-CM | POA: Diagnosis not present

## 2016-03-02 DIAGNOSIS — E785 Hyperlipidemia, unspecified: Secondary | ICD-10-CM | POA: Diagnosis present

## 2016-03-02 DIAGNOSIS — E669 Obesity, unspecified: Secondary | ICD-10-CM | POA: Diagnosis not present

## 2016-03-02 DIAGNOSIS — I10 Essential (primary) hypertension: Secondary | ICD-10-CM | POA: Diagnosis not present

## 2016-03-02 DIAGNOSIS — R058 Other specified cough: Secondary | ICD-10-CM | POA: Diagnosis present

## 2016-03-02 DIAGNOSIS — G2581 Restless legs syndrome: Secondary | ICD-10-CM | POA: Diagnosis not present

## 2016-03-02 DIAGNOSIS — R4182 Altered mental status, unspecified: Secondary | ICD-10-CM | POA: Diagnosis not present

## 2016-03-02 DIAGNOSIS — G4733 Obstructive sleep apnea (adult) (pediatric): Secondary | ICD-10-CM | POA: Insufficient documentation

## 2016-03-02 DIAGNOSIS — M6289 Other specified disorders of muscle: Secondary | ICD-10-CM | POA: Diagnosis not present

## 2016-03-02 DIAGNOSIS — I639 Cerebral infarction, unspecified: Secondary | ICD-10-CM | POA: Diagnosis not present

## 2016-03-02 LAB — I-STAT CHEM 8, ED
BUN: 16 mg/dL (ref 6–20)
Calcium, Ion: 1.15 mmol/L (ref 1.12–1.23)
Chloride: 98 mmol/L — ABNORMAL LOW (ref 101–111)
Creatinine, Ser: 0.7 mg/dL (ref 0.44–1.00)
Glucose, Bld: 254 mg/dL — ABNORMAL HIGH (ref 65–99)
HEMATOCRIT: 37 % (ref 36.0–46.0)
HEMOGLOBIN: 12.6 g/dL (ref 12.0–15.0)
Potassium: 3.8 mmol/L (ref 3.5–5.1)
SODIUM: 136 mmol/L (ref 135–145)
TCO2: 24 mmol/L (ref 0–100)

## 2016-03-02 LAB — CBC
HCT: 35.7 % — ABNORMAL LOW (ref 36.0–46.0)
HEMATOCRIT: 37.5 % (ref 36.0–46.0)
HEMOGLOBIN: 12.2 g/dL (ref 12.0–15.0)
Hemoglobin: 12.1 g/dL (ref 12.0–15.0)
MCH: 32.4 pg (ref 26.0–34.0)
MCH: 31.9 pg (ref 26.0–34.0)
MCHC: 33.9 g/dL (ref 30.0–36.0)
MCHC: 32.5 g/dL (ref 30.0–36.0)
MCV: 95.5 fL (ref 78.0–100.0)
MCV: 97.9 fL (ref 78.0–100.0)
Platelets: 191 10*3/uL (ref 150–400)
Platelets: 191 10*3/uL (ref 150–400)
RBC: 3.74 MIL/uL — AB (ref 3.87–5.11)
RBC: 3.83 MIL/uL — ABNORMAL LOW (ref 3.87–5.11)
RDW: 13.4 % (ref 11.5–15.5)
RDW: 13.5 % (ref 11.5–15.5)
WBC: 9.4 10*3/uL (ref 4.0–10.5)
WBC: 8.8 10*3/uL (ref 4.0–10.5)

## 2016-03-02 LAB — RAPID URINE DRUG SCREEN, HOSP PERFORMED
AMPHETAMINES: NOT DETECTED
Barbiturates: NOT DETECTED
Benzodiazepines: NOT DETECTED
Cocaine: NOT DETECTED
OPIATES: NOT DETECTED
TETRAHYDROCANNABINOL: NOT DETECTED

## 2016-03-02 LAB — CREATININE, SERUM: Creatinine, Ser: 0.86 mg/dL (ref 0.44–1.00)

## 2016-03-02 LAB — GLUCOSE, CAPILLARY: Glucose-Capillary: 189 mg/dL — ABNORMAL HIGH (ref 65–99)

## 2016-03-02 MED ORDER — LORAZEPAM 1 MG PO TABS: 1.0000 mg | ORAL_TABLET | Freq: Four times a day (QID) | ORAL | Status: DC | PRN

## 2016-03-02 MED ORDER — LORAZEPAM 2 MG/ML IJ SOLN: 1.0000 mg | Freq: Four times a day (QID) | INTRAMUSCULAR | Status: DC | PRN

## 2016-03-02 MED ORDER — ASPIRIN 300 MG RE SUPP: 300.0000 mg | Freq: Every day | RECTAL | Status: DC

## 2016-03-02 MED ORDER — VITAMIN B-1 100 MG PO TABS
100.0000 mg | ORAL_TABLET | Freq: Every day | ORAL | Status: DC
  Administered 2016-03-03: 100 mg via ORAL
  Filled 2016-03-02: qty 1

## 2016-03-02 MED ORDER — SODIUM CHLORIDE 0.9 % IV SOLN
INTRAVENOUS | Status: AC
Start: 2016-03-02 — End: 2016-03-03
  Administered 2016-03-02: 19:00:00 via INTRAVENOUS

## 2016-03-02 MED ORDER — MAGNESIUM OXIDE 400 (241.3 MG) MG PO TABS
1400.0000 mg | ORAL_TABLET | Freq: Every day | ORAL | Status: DC
  Administered 2016-03-03: 1400 mg via ORAL
  Filled 2016-03-02: qty 4

## 2016-03-02 MED ORDER — ASPIRIN 325 MG PO TABS
325.0000 mg | ORAL_TABLET | Freq: Every day | ORAL | Status: DC
  Administered 2016-03-03: 325 mg via ORAL
  Filled 2016-03-02: qty 1

## 2016-03-02 MED ORDER — STROKE: EARLY STAGES OF RECOVERY BOOK
Freq: Once | Status: DC
  Filled 2016-03-02: qty 1

## 2016-03-02 MED ORDER — GABAPENTIN 300 MG PO CAPS
300.0000 mg | ORAL_CAPSULE | Freq: Three times a day (TID) | ORAL | Status: DC
  Administered 2016-03-02 – 2016-03-03 (×4): 300 mg via ORAL
  Filled 2016-03-02 (×4): qty 1

## 2016-03-02 MED ORDER — SIMVASTATIN 20 MG PO TABS
20.0000 mg | ORAL_TABLET | Freq: Every evening | ORAL | Status: DC
  Administered 2016-03-03: 20 mg via ORAL
  Filled 2016-03-02: qty 1

## 2016-03-02 MED ORDER — SUMATRIPTAN 20 MG/ACT NA SOLN: 20.0000 mg | NASAL | Status: DC | PRN

## 2016-03-02 MED ORDER — THIAMINE HCL 100 MG/ML IJ SOLN: 100.0000 mg | Freq: Every day | INTRAMUSCULAR | Status: DC

## 2016-03-02 MED ORDER — ZOLPIDEM TARTRATE 5 MG PO TABS
10.0000 mg | ORAL_TABLET | Freq: Every day | ORAL | Status: DC
  Administered 2016-03-02: 10 mg via ORAL
  Filled 2016-03-02: qty 2

## 2016-03-02 MED ORDER — FOLIC ACID 1 MG PO TABS
1.0000 mg | ORAL_TABLET | Freq: Every day | ORAL | Status: DC
  Administered 2016-03-02 – 2016-03-03 (×2): 1 mg via ORAL
  Filled 2016-03-02 (×2): qty 1

## 2016-03-02 MED ORDER — SENNOSIDES-DOCUSATE SODIUM 8.6-50 MG PO TABS: 1.0000 | ORAL_TABLET | Freq: Every evening | ORAL | Status: DC | PRN

## 2016-03-02 MED ORDER — ADULT MULTIVITAMIN W/MINERALS CH
1.0000 | ORAL_TABLET | Freq: Every day | ORAL | Status: DC
  Administered 2016-03-03: 1 via ORAL
  Filled 2016-03-02: qty 1

## 2016-03-02 MED ORDER — ESCITALOPRAM OXALATE 10 MG PO TABS
20.0000 mg | ORAL_TABLET | Freq: Every day | ORAL | Status: DC
  Administered 2016-03-02 – 2016-03-03 (×2): 20 mg via ORAL
  Filled 2016-03-02: qty 2

## 2016-03-02 MED ORDER — ASPIRIN 81 MG PO CHEW
324.0000 mg | CHEWABLE_TABLET | Freq: Once | ORAL | Status: AC
  Administered 2016-03-02: 324 mg via ORAL
  Filled 2016-03-02: qty 4

## 2016-03-02 MED ORDER — INSULIN ASPART 100 UNIT/ML ~~LOC~~ SOLN
0.0000 [IU] | Freq: Three times a day (TID) | SUBCUTANEOUS | Status: DC
  Administered 2016-03-03: 3 [IU] via SUBCUTANEOUS
  Administered 2016-03-03: 2 [IU] via SUBCUTANEOUS

## 2016-03-02 MED ORDER — INSULIN ASPART 100 UNIT/ML ~~LOC~~ SOLN: 0.0000 [IU] | Freq: Every day | SUBCUTANEOUS | Status: DC

## 2016-03-02 MED ORDER — ENOXAPARIN SODIUM 40 MG/0.4ML ~~LOC~~ SOLN
40.0000 mg | SUBCUTANEOUS | Status: DC
  Administered 2016-03-02: 40 mg via SUBCUTANEOUS
  Filled 2016-03-02: qty 0.4

## 2016-03-02 NOTE — ED Provider Notes (Signed)
[facility]-EMERGENCY DEPT Provider Note   CSN: [id number] Arrival date & time: 03/02/16  1227  First Provider Contact:  First MD Initiated Contact with Patient 03/02/16 1249        History   Chief Complaint Chief Complaint  Patient presents with  . Transient Ischemic Attack    HPI [name] [name] is a 65 y.o. female.Patient brought by EMS developed sudden onset right sided facial weakness 11:40 AM today. She was sitting at lunch with friends. Her hand writing became abnormal, and she had difficulty walking. All symptoms resolved without treatment. She became p asymptomatic upon arrival here. Denies headache denies visual changes no other associated symptoms  HPI  Past Medical History:  Diagnosis Date  . Anxiety   . Anxiety   . Asthma    no inhaler use  . Bronchitis   . Depression   . Depression   . Diabetes mellitus   . Difficult intubation    previous intubations in 2012 with 7.0ETT using MAC 3 or 4; on 11/24/13 unable to intubate using Miller 3 followed by glidescope attempt at [facility]  . Dyslipidemia   . Dysrhythmia 2008   tachycardia/SVT  . GERD (gastroesophageal reflux disease)   . Hypertension   . OSA (obstructive sleep apnea)    uses a CPAP  . Pneumonia   . RLS (restless legs syndrome)   . Rosacea   Migraine headaches TIA  Patient Active Problem List   Diagnosis Date Noted  . Cluster headaches 12/02/2014  . Upper airway cough syndrome 10/25/2012  . HBP (high blood pressure) 10/25/2012    Past Surgical History:  Procedure Laterality Date  . CARDIAC ELECTROPHYSIOLOGY MAPPING AND ABLATION     at [facility] ~ 2008  . CARPAL TUNNEL RELEASE Bilateral   . CESAREAN SECTION    . CHOLECYSTECTOMY    . DILATION AND CURETTAGE OF UTERUS    . HYSTEROSCOPY W/D&C  03/04/2012   Procedure: DILATATION AND CURETTAGE /HYSTEROSCOPY;  Surgeon: Dorien Chihuahua. Richardson Dopp, MD;  Location: [facility];  Service: Gynecology;  Laterality: N/A;  . SHOULDER ARTHROSCOPY Left 2012    x 2  . SHOULDER  ARTHROSCOPY WITH ROTATOR CUFF REPAIR Right 12/01/2013   Procedure: RIGHT SHOULDER ARTHROSCOPY WITH ROTATOR CUFF REPAIR;  Surgeon: Velna Ochs, MD;  Location: [facility] OR;  Service: Orthopedics;  Laterality: Right;  . TONSILLECTOMY      OB History    No data available       Home Medications    Prior to Admission medications   Medication Sig Start Date End Date Taking? Authorizing Provider  aspirin EC 81 MG tablet Take 81 mg by mouth daily.    Historical Provider, MD  escitalopram (LEXAPRO) 20 MG tablet Take 20 mg by mouth daily.    Historical Provider, MD  gabapentin (NEURONTIN) 300 MG capsule Take 1 capsule (300 mg total) by mouth 3 (three) times daily. 07/13/13   Nyoka Cowden, MD  lidocaine (LIDODERM) 5 % Place 1 patch onto the skin daily. Remove & Discard patch within 12 hours or as directed by MD    Historical Provider, MD  MAGNESIUM PO Take 1,300 mg by mouth daily.    Historical Provider, MD  metFORMIN (GLUCOPHAGE) 500 MG tablet Take 500 mg by mouth 2 (two) times daily with a meal.    Historical Provider, MD  Methylcobalamin (METHYL B-12 PO) Take 1 tablet by mouth daily.    Historical Provider, MD  metoprolol succinate (TOPROL-XL) 25 MG 24 hr tablet  Take 12.5 mg by mouth 2 (two) times daily.    Historical Provider, MD  olmesartan-hydrochlorothiazide (BENICAR HCT) 40-25 MG per tablet Take 0.5 tablets by mouth daily.    Historical Provider, MD  OVER THE COUNTER MEDICATION Take 400 mg by mouth daily. Super Bio-Curcumin    Historical Provider, MD  oxyCODONE-acetaminophen (ROXICET) 5-325 MG per tablet Take 1-2 tablets by mouth every 6 (six) hours as needed for severe pain. Patient not taking: Reported on 12/02/2014 12/01/13   Elodia FlorenceAndrew Nida, PA-C  simvastatin (ZOCOR) 20 MG tablet Take 20 mg by mouth every evening.    Historical Provider, MD  SUMAtriptan (IMITREX) 20 MG/ACT nasal spray Place 1 spray (20 mg total) into the nose every 2 (two) hours as needed for migraine or headache. May repeat in 2  hours if headache persists or recurs. 12/02/14   Todd McVeigh, PA  Ubiquinol 100 MG CAPS Take 100 mg by mouth daily.    Historical Provider, MD  VITAMIN D-VITAMIN K PO Take 1 tablet by mouth daily.    Historical Provider, MD  zolpidem (AMBIEN) 10 MG tablet Take 10 mg by mouth at bedtime.    Historical Provider, MD    Family History Family History  Problem Relation Age of Onset  . Breast cancer Mother   . Emphysema Mother     was a smoker  . Cancer Mother   . Heart disease Mother   . Hyperlipidemia Mother     Social History Social History  Substance Use Topics  . Smoking status: Never Smoker  . Smokeless tobacco: Never Used  . Alcohol use 3.5 oz/week    7 drink(s) per week     Comment: weekly     Allergies   Prevacid [lansoprazole]   Review of Systems Review of Systems  HENT: Negative.        Right-sided facial weakness  Respiratory: Negative.   Cardiovascular: Negative.   Gastrointestinal: Negative.   Musculoskeletal: Positive for gait problem.  Skin: Negative.   Allergic/Immunologic: Positive for immunocompromised state.       Diabetic  Neurological: Positive for weakness.  Psychiatric/Behavioral: Negative.   All other systems reviewed and are negative.    Physical Exam Updated Vital Signs BP 131/73 (BP Location: Right Arm)   Pulse 88   Temp 98.6 F (37 C)   Resp 18   Ht 5\' 4"  (1.626 m)   Wt 207 lb (93.9 kg)   LMP 03/03/2012   SpO2 96%   BMI 35.53 kg/m   Physical Exam  Constitutional: She is oriented to person, place, and time. She appears well-developed and well-nourished.  HENT:  Head: Normocephalic and atraumatic.  Eyes: Conjunctivae are normal. Pupils are equal, round, and reactive to light.  Neck: Neck supple. No tracheal deviation present. No thyromegaly present.  Cardiovascular: Normal rate and regular rhythm.   No murmur heard. Pulmonary/Chest: Effort normal and breath sounds normal.  Abdominal: Soft. Bowel sounds are normal. She exhibits  no distension. There is no tenderness.  Musculoskeletal: Normal range of motion. She exhibits no edema or tenderness.  Neurological: She is alert and oriented to person, place, and time. She displays abnormal reflex. Coordination normal.  DTRs symmetric bilaterally at knee jerk ankle jerk and biceps toes downward going bilaterally. Gait normal Romberg normal pronator drift normal finger to nose normal  Skin: Skin is warm and dry. No rash noted.  Psychiatric: She has a normal mood and affect.  Nursing note and vitals reviewed.    ED Treatments /  Results  Labs (all labs ordered are listed, but only abnormal results are displayed) Labs Reviewed  URINE RAPID DRUG SCREEN, HOSP PERFORMED  CBC  I-STAT CHEM 8, ED    EKG  EKG Interpretation None       Radiology No results found.  Procedures Procedures (including critical care time)  Medications Ordered in ED Medications - No data to display   Initial Impression / Assessment and Plan / ED Course  I have reviewed the triage vital signs and the nursing notes.  Pertinent labs & imaging results that were available during my care of the patient were reviewed by me and considered in my medical decision making (see chart for details).  Clinical Course  Patient passed stroke swallow screen 4:25 PM patient remains asymptomatic  aspirin ordered Results for orders placed or performed during the hospital encounter of 03/02/16  Urine rapid drug screen (hosp performed)not at [facility]  Result Value Ref Range   Opiates NONE DETECTED NONE DETECTED   Cocaine NONE DETECTED NONE DETECTED   Benzodiazepines NONE DETECTED NONE DETECTED   Amphetamines NONE DETECTED NONE DETECTED   Tetrahydrocannabinol NONE DETECTED NONE DETECTED   Barbiturates NONE DETECTED NONE DETECTED  CBC  Result Value Ref Range   WBC 9.4 4.0 - 10.5 K/uL   RBC 3.74 (L) 3.87 - 5.11 MIL/uL   Hemoglobin 12.1 12.0 - 15.0 g/dL   HCT 16.1 (L) 09.6 - 04.5 %   MCV 95.5 78.0 - 100.0  fL   MCH 32.4 26.0 - 34.0 pg   MCHC 33.9 30.0 - 36.0 g/dL   RDW 40.9 81.1 - 91.4 %   Platelets 191 150 - 400 K/uL  I-stat chem 8, ed  Result Value Ref Range   Sodium 136 135 - 145 mmol/L   Potassium 3.8 3.5 - 5.1 mmol/L   Chloride 98 (L) 101 - 111 mmol/L   BUN 16 6 - 20 mg/dL   Creatinine, Ser 7.82 0.44 - 1.00 mg/dL   Glucose, Bld 956 (H) 65 - 99 mg/dL   Calcium, Ion 2.13 0.86 - 1.23 mmol/L   TCO2 24 0 - 100 mmol/L   Hemoglobin 12.6 12.0 - 15.0 g/dL   HCT 57.8 46.9 - 62.9 %   Mr Brain Wo Contrast  Result Date: 03/02/2016 CLINICAL DATA:  Right-sided weakness. Slurred speech and difficulty walking after eating lunch today. Five similar episodes over the past 5 months, each lasting approximately 1 hour. EXAM: MRI HEAD WITHOUT CONTRAST TECHNIQUE: Multiplanar, multiecho pulse sequences of the brain and surrounding structures were obtained without intravenous contrast. COMPARISON:  01/05/2016 FINDINGS: There are 2 punctate foci of hyperintense trace diffusion weighted signal, 1 in the left corona radiata and 1 in the right occipital white matter without corresponding reduced ADC. The focus in the left corona radiata has an appearance on standard T1 and T2 sequences of a late subacute to chronic infarct, new from the prior MRI. No restricted diffusion is identified elsewhere to indicate acute infarct. There is no evidence of intracranial hemorrhage, mass, midline shift, or extra-axial fluid collection. Ventricles and sulci are normal. Cerebral white matter T2 hyperintensities are confluent in the periventricular regions and unchanged from the prior study, as is extensive T2 signal abnormality in the pons. Orbits are unremarkable. Paranasal sinuses and mastoid air cells are clear. Major intracranial vascular flow voids are preserved. No focal osseous lesion identified. IMPRESSION: 1. No acute intracranial abnormality. 2. Interval punctate non acute small vessel infarcts in the left corona radiata and  right  occipital lobe. 3. Moderately extensive chronic small vessel ischemic disease in the cerebral white matter and brainstem. Electronically Signed   By: Sebastian Ache M.D.   On: 03/02/2016 16:01    Final Clinical Impressions(s) / ED Diagnoses  Hospitalist service consulted. Final diagnoses:  None   Plan 23 hour observation telemetry Diagnosis transient ischemic attack New Prescriptions New Prescriptions   No medications on file     Doug Sou, MD 03/02/16 1631

## 2016-03-02 NOTE — Progress Notes (Signed)
Pt arrived on unit from ED. Pt alert and oriented to unit. Pt educated on call bell, phone, dinner menu, and diet. Call button and phone within reach.

## 2016-03-02 NOTE — Progress Notes (Signed)
Pt's home cpap.  RT set up and has ready for pt.  Rt will monitor.

## 2016-03-02 NOTE — ED Notes (Signed)
Pt returned from MRI.  Pt alert and oriented x's 3.  All previous neuro symptoms have resolved at this point.  Pt denies any pain and has no complaint at this time

## 2016-03-02 NOTE — ED Notes (Signed)
Pt in MRI at this time.  Family waiting in room

## 2016-03-02 NOTE — ED Triage Notes (Signed)
PT reports at approx. 1130 today  after eating lunch with a friend she noticed she had slured speech and had difficulty walking. Pt reports 5 such episodes over the last 5 months.  Pt reports each episode last about an hour . Pt also reports she lost her job and health insurance 2 months ago.

## 2016-03-02 NOTE — Consult Note (Addendum)
Neurology Consultation Reason for Consult: Transient right-sided weakness and numbness Referring Physician: Mariea ClontsEmokpae, C  CC: Right-sided weakness  History is obtained from: Patient  HPI: [name] [name] is a 65 y.o. female who presents following an episode of right-sided weakness, numbness, slurred speech. She states that she was at [employer], then began having some difficulty using her right hand. She states that she felt numb on the right. She denies tingling. She then noticed that she wasn't saying things well to the right. She tried to stand up and stumbled. Therefore EMS was called, however the symptoms resolve prior to arrival in the ER. She has had 5 similar episodes over the past few weeks. She had no spots in her vision, just lack of vision to the right.  She began having odd symptoms in May, and had several episodes of confusion associated with severe headache which were diagnosis as complicated migraine.   Then about 3 weeks ago, she began having episodes similar to the one today. She has had a total of 4 - 5 of these episodes, none of which are associated with headache. She retains consciousness throughout the episode, and the last for up to 3 hours.  LKW: She is currently back to baseline tpa given?: Resolve symptoms   ROS: A 14 point ROS was performed and is negative except as noted in the HPI.  Past Medical History:  Diagnosis Date  . Anxiety   . Anxiety   . Asthma    no inhaler use  . Bronchitis   . Depression   . Depression   . Diabetes mellitus   . Difficult intubation    previous intubations in 2012 with 7.0ETT using MAC 3 or 4; on 11/24/13 unable to intubate using Miller 3 followed [facility]  . Dyslipidemia   . Dysrhythmia 2008   tachycardia/SVT  . GERD (gastroesophageal reflux disease)   . Hypertension   . OSA (obstructive sleep apnea)    uses a CPAP  . Pneumonia   . RLS (restless legs syndrome)   . Rosacea      Family  History  Problem Relation Age of Onset  . Breast cancer Mother   . Emphysema Mother     was a smoker  . Cancer Mother   . Heart disease Mother   . Hyperlipidemia Mother      Social History:  reports that she has never smoked. She has never used smokeless tobacco. She reports that she drinks about 3.5 oz of alcohol per week . She reports that she does not use drugs.   Exam: Current vital signs: BP (!) 142/68 (BP Location: Left Arm)   Pulse 71   Temp 98.6 F (37 C) (Oral)   Resp 16   Ht 5\' 4"  (1.626 m)   Wt 93.9 kg (207 lb)   LMP 03/03/2012   SpO2 96%   BMI 35.53 kg/m  Vital signs in last 24 hours: Temp:  [98.2 F (36.8 C)-98.6 F (37 C)] 98.6 F (37 C) (08/06 2200) Pulse Rate:  [71-88] 71 (08/06 2200) Resp:  [16-18] 16 (08/06 2200) BP: (100-142)/(62-78) 142/68 (08/06 2200) SpO2:  [96 %-99 %] 96 % (08/06 2200) Weight:  [93.9 kg (207 lb)] 93.9 kg (207 lb) (08/06 1240)   Physical Exam  Constitutional: Appears well-developed and well-nourished.  Psych: Affect appropriate to situation Eyes: No scleral injection HENT: No OP obstrucion Head: Normocephalic.  Cardiovascular: Normal rate and regular rhythm.  Respiratory: Effort normal and  breath sounds normal to anterior ascultation GI: Soft.  No distension. There is no tenderness.  Skin: WDI  Neuro: Mental Status: Patient is awake, alert, oriented to person, place, month, year, and situation. Patient is able to give a clear and coherent history. No signs of aphasia or neglect Cranial Nerves: II: Visual Fields are full. Pupils are equal, round, and reactive to light.   III,IV, VI: EOMI without ptosis or diploplia.  V: Facial sensation is symmetric to temperature VII: Facial movement is symmetric.  VIII: hearing is intact to voice X: Uvula elevates symmetrically XI: Shoulder shrug is symmetric. XII: tongue is midline without atrophy or fasciculations.  Motor: Tone is normal. Bulk is normal. 5/5 strength was  present in all four extremities.  Sensory: Sensation is symmetric to light touch and temperature in the arms and legs. Cerebellar: FNF and HKS are intact bilaterally  I have reviewed labs in epic and the results pertinent to this consultation are: Elevated glucose  I have reviewed the images obtained: MRI brain-negative for acute findings   Impression: 65 year old female with transient episodes of right-sided numbness, weakness, slurred speech that been happening intermittently over the past few weeks. The new strokes on MRI are quite concerning for cardioembolic or artery to artery embolic phenomenoa. The episodes sound most like TIA, but the number of sterotyped spells that she has had now (5) without permanent deficits is unusual. She has no positive symptoms that would be more associated with seizure.  Though there is no acute infarct on MRI, she has had multiple infarcts since her previous MRI in June and I think that this is vascular in nature. With the stereotyped spells, however, I will also get an EEG.   Recommendations: 1. HgbA1c, fasting lipid panel 2. CT angiogram of the neck 3. Frequent neuro checks 4. Echocardiogram 5. Prophylactic therapy-Antiplatelet med: Aspirin - dose  PO or  PR 6. Risk factor modification 7. Telemetry monitoring 8. PT consult, OT consult, Speech consult 9. EEG 10. please page stroke NP  Or  PA  Or MD from 8am -4 pm starting 8/7 as this patient will be followed by the stroke team at this point.   You can look them up on [email or web address]      Ritta Slot, MD [facility] [phone]  If 7pm- 7am, please page neurology on call as listed in AMION.

## 2016-03-02 NOTE — ED Notes (Signed)
Admitting MD at bedside at this time.

## 2016-03-02 NOTE — ED Notes (Signed)
Pt to ED and then to 5C13

## 2016-03-02 NOTE — ED Notes (Signed)
Charge nurse on [facility]C updated on plan of care.

## 2016-03-02 NOTE — H&P (Signed)
History and Physical    [name] [name] XBJ:[id number] DOB: 1951-06-14 DOA: 03/02/2016  PCP: [name] [name] Patient coming from: home  Chief Complaint: right sided weakness/numbness/ataxia  HPI: [name] [name] is a 65 y.o. female with medical history significant for hypertension, diabetes, dyslipidemia, obesity, TIA presents emergency Department chief complaint right-sided weakness with slurred speech and ataxia. Workup in the emergency department includes MRI without acute infarct but concern for TIA.  Information is obtained from the patient and her friends were at the bedside. She was a [employer] with friends who noticed her right eye starting to droop. Patient indicates she felt some "numbness" on the right side of her face. Shortly thereafter patient having difficulty using her right hand. Inquired if her friends were "noticing" she was having trouble seeing her right hand. Her friends also noticed some slurred speech. They attempted to get up and ambulate to bathroom and patient gait was "stumbling". Patient denies any pain during this time she denies headache visual disturbances syncope or near-syncope. She denies any difficulty swallowing. She reports similar symptoms are minimally over the last 4 weeks. She reports approximately 5 similar episodes over the last 4 weeks. She states each episode lasted no more than an hour. He was called EMS and symptoms were all resolved prior to arrival to the emergency department. She denies any recent illness fever chills coughing chest pain palpitation. She denies any nausea vomiting diarrhea constipation. She denies dysuria hematuria frequency or urgency. She denies lower extremity edema or orthopnea.    ED Course: Emergency department she's afebrile hemodynamically stable and not hypoxic. She is provided with aspirin after she passed the bedside swallow eval  Review of Systems: As per HPI otherwise 10 point review of systems negative.   Ambulatory  Status: Ambulates independently with steady gait  Past Medical History:  Diagnosis Date  . Anxiety   . Anxiety   . Asthma    no inhaler use  . Bronchitis   . Depression   . Depression   . Diabetes mellitus   . Difficult intubation    previous intubations in 2012 with 7.0ETT using MAC 3 or 4; on 11/24/13 unable to intubate using Miller 3 [facility]  . Dyslipidemia   . Dysrhythmia 2008   tachycardia/SVT  . GERD (gastroesophageal reflux disease)   . Hypertension   . OSA (obstructive sleep apnea)    uses a CPAP  . Pneumonia   . RLS (restless legs syndrome)   . Rosacea     Past Surgical History:  Procedure Laterality Date  . CARDIAC ELECTROPHYSIOLOGY MAPPING AND ABL[facility]     [facility]  . CARPAL TUNNEL RELEASE Bilateral   . CESAREAN SECTION    . CHOLECYSTECTOMY    . DILATION AND CURETTAGE OF UTERUS    . HYSTEROSCOPY W/D&C  03/04/2012   Procedure: DILATATION AND CURETTAGE /HYSTEROSCOPY;  Surgeon: Dorien Chihuahuaara J. Richardson Doppole, [name];  Location: [facility];  Service: Gynecology;  Laterality: N/A;  . SHOULDER ARTHROSCOPY Left 2012    x 2  . SHOULDER ARTHROSCOPY WITH ROTATOR CUFF REPAIR Right 12/01/2013   Procedure: RIGHT SHOULDER ARTHROSCOPY WITH ROTATOR CUFF REPAIR;  Surgeon: Velna OchsPeter G Dalldorf, [name];  Location: [facility] OR;  Service: Orthopedics;  Laterality: Right;  . TONSILLECTOMY      Social History   Social History  . Marital status: Widowed    Spouse name: N/A  . Number of children: 1  . Years of education: N/A  Occupational History  . Secretary Work [employer]   Social History Main Topics  . Smoking status: Never Smoker  . Smokeless tobacco: Never Used  . Alcohol use 3.5 oz/week    7 drink(s) per week     Comment: weekly  . Drug use: No  . Sexual activity: Not on file   Other Topics Concern  . Not on file   Social History Narrative  . No narrative on file    Allergies  Allergen Reactions  . Prevacid [Lansoprazole] Hives     Family History  Problem Relation Age of Onset  . Breast cancer Mother   . Emphysema Mother     was a smoker  . Cancer Mother   . Heart disease Mother   . Hyperlipidemia Mother     Prior to Admission medications   Medication Sig Start Date End Date Taking? Authorizing Provider  aspirin EC 81 MG tablet Take 81 mg by mouth daily.    Historical Provider, [name]  escitalopram (LEXAPRO) 20 MG tablet Take 20 mg by mouth daily.    Historical Provider, [name]  gabapentin (NEURONTIN) 300 MG capsule Take 1 capsule (300 mg total) by mouth 3 (three) times daily. 07/13/13   Nyoka Cowden, [name]  lidocaine (LIDODERM) 5 % Place 1 patch onto the skin daily. Remove & Discard patch within 12 hours or as directed by [name]    Historical Provider, [name]  MAGNESIUM PO Take 1,300 mg by mouth daily.    Historical Provider, [name]  metFORMIN (GLUCOPHAGE) 500 MG tablet Take 500 mg by mouth 2 (two) times daily with a meal.    Historical Provider, [name]  Methylcobalamin (METHYL B-12 PO) Take 1 tablet by mouth daily.    Historical Provider, [name]  metoprolol succinate (TOPROL-XL) 25 MG 24 hr tablet Take 12.5 mg by mouth 2 (two) times daily.    Historical Provider, [name]  olmesartan-hydrochlorothiazide (BENICAR HCT) 40-25 MG per tablet Take 0.5 tablets by mouth daily.    Historical Provider, [name]  OVER THE COUNTER MEDICATION Take 400 mg by mouth daily. Super Bio-Curcumin    Historical Provider, [name]  oxyCODONE-acetaminophen (ROXICET) 5-325 MG per tablet Take 1-2 tablets by mouth every 6 (six) hours as needed for severe pain. Patient not taking: Reported on 12/02/2014 12/01/13   Elodia Florence, PA-C  simvastatin (ZOCOR) 20 MG tablet Take 20 mg by mouth every evening.    Historical Provider, [name]  SUMAtriptan (IMITREX) 20 MG/ACT nasal spray Place 1 spray (20 mg total) into the nose every 2 (two) hours as needed for migraine or headache. May repeat in 2 hours if headache persists or recurs. 12/02/14   Todd McVeigh, PA  Ubiquinol 100 MG CAPS Take 100 mg by mouth  daily.    Historical Provider, [name]  VITAMIN D-VITAMIN K PO Take 1 tablet by mouth daily.    Historical Provider, [name]  zolpidem (AMBIEN) 10 MG tablet Take 10 mg by mouth at bedtime.    Historical Provider, [name]    Physical Exam: Vitals:   03/02/16 1240 03/02/16 1303 03/02/16 1448 03/02/16 1620  BP: 131/73  100/70 106/62  Pulse: 88  82 87  Resp: 18   17  Temp: 98.6 F (37 C) 98.6 F (37 C)    TempSrc: Oral     SpO2: 96%  98% 99%  Weight: 93.9 kg (207 lb)     Height: 5\' 4"  (1.626 m)        General:  Appears calm and comfortableObese no acute  distress Eyes:  PERRL, EOMI, normal lids, iris ENT:  grossly normal hearing, lips & tongue, mucous membranes of her mouth are pink slightly dry Neck:  no LAD, masses or thyromegaly Cardiovascular:  RRR, no m/r/g. No LE edema.  Respiratory:  Normal effort breath sounds somewhat distant I hear no wheezes no rhonchi Abdomen:  soft, ntnd, positive bowel sounds no guarding or rebounding Skin:  no rash or induration seen on limited exam Musculoskeletal:  grossly normal tone BUE/BLE, good ROM, no bony abnormality, joints without swelling/erythema Psychiatric:  grossly normal mood and affect, speech fluent and appropriate, AOx3 Neurologic:  CN 2-12 grossly intact, moves all extremities in coordinated fashion, sensation intact, alert and oriented 3 speech clear facial symmetry no pronator drift finger-nose within the limits of normal  Labs on Admission: I have personally reviewed following labs and imaging studies  CBC:  Recent Labs Lab 03/02/16 1330 03/02/16 1337  WBC 9.4  --   HGB 12.1 12.6  HCT 35.7* 37.0  MCV 95.5  --   PLT 191  --    Basic Metabolic Panel:  Recent Labs Lab 03/02/16 1337  NA 136  K 3.8  CL 98*  GLUCOSE 254*  BUN 16  CREATININE 0.70   GFR: Estimated Creatinine Clearance: 76.9 mL/min (by C-G formula based on SCr of 0.8 mg/dL). Liver Function Tests: No results for input(s): AST, ALT, ALKPHOS, BILITOT, PROT, ALBUMIN  in the last 168 hours. No results for input(s): LIPASE, AMYLASE in the last 168 hours. No results for input(s): AMMONIA in the last 168 hours. Coagulation Profile: No results for input(s): INR, PROTIME in the last 168 hours. Cardiac Enzymes: No results for input(s): CKTOTAL, CKMB, CKMBINDEX, TROPONINI in the last 168 hours. BNP (last 3 results) No results for input(s): PROBNP in the last 8760 hours. HbA1C: No results for input(s): HGBA1C in the last 72 hours. CBG: No results for input(s): GLUCAP in the last 168 hours. Lipid Profile: No results for input(s): CHOL, HDL, LDLCALC, TRIG, CHOLHDL, LDLDIRECT in the last 72 hours. Thyroid Function Tests: No results for input(s): TSH, T4TOTAL, FREET4, T3FREE, THYROIDAB in the last 72 hours. Anemia Panel: No results for input(s): VITAMINB12, FOLATE, FERRITIN, TIBC, IRON, RETICCTPCT in the last 72 hours. Urine analysis:    Component Value Date/Time   COLORURINE YELLOW 03/04/2012 0947   APPEARANCEUR CLEAR 03/04/2012 0947   LABSPEC 1.025 03/04/2012 0947   PHURINE 5.5 03/04/2012 0947   GLUCOSEU NEGATIVE 03/04/2012 0947   HGBUR NEGATIVE 03/04/2012 0947   BILIRUBINUR NEGATIVE 03/04/2012 0947   KETONESUR NEGATIVE 03/04/2012 0947   PROTEINUR NEGATIVE 03/04/2012 0947   UROBILINOGEN 0.2 03/04/2012 0947   NITRITE NEGATIVE 03/04/2012 0947   LEUKOCYTESUR NEGATIVE 03/04/2012 0947    Creatinine Clearance: Estimated Creatinine Clearance: 76.9 mL/min (by C-G formula based on SCr of 0.8 mg/dL).  Sepsis Labs: (procalcitonin:4,lacticidven:4) )No results found for this or any previous visit (from the past 240 hour(s)).   Radiological Exams on Admission: Dg Chest 2 View  Result Date: 03/02/2016 CLINICAL DATA:  Right-sided weakness EXAM: CHEST  2 VIEW COMPARISON:  08/21/2014 FINDINGS: The heart size and mediastinal contours are within normal limits. Both lungs are clear. The visualized skeletal structures are unremarkable. IMPRESSION: No active  cardiopulmonary disease. Electronically Signed   By: Alcide Clever M.D.   On: 03/02/2016 17:15   Mr Brain Wo Contrast  Result Date: 03/02/2016 CLINICAL DATA:  Right-sided weakness. Slurred speech and difficulty walking after eating lunch today. Five similar episodes over the past 5 months,  each lasting approximately 1 hour. EXAM: MRI HEAD WITHOUT CONTRAST TECHNIQUE: Multiplanar, multiecho pulse sequences of the brain and surrounding structures were obtained without intravenous contrast. COMPARISON:  01/05/2016 FINDINGS: There are 2 punctate foci of hyperintense trace diffusion weighted signal, 1 in the left corona radiata and 1 in the right occipital white matter without corresponding reduced ADC. The focus in the left corona radiata has an appearance on standard T1 and T2 sequences of a late subacute to chronic infarct, new from the prior MRI. No restricted diffusion is identified elsewhere to indicate acute infarct. There is no evidence of intracranial hemorrhage, mass, midline shift, or extra-axial fluid collection. Ventricles and sulci are normal. Cerebral white matter T2 hyperintensities are confluent in the periventricular regions and unchanged from the prior study, as is extensive T2 signal abnormality in the pons. Orbits are unremarkable. Paranasal sinuses and mastoid air cells are clear. Major intracranial vascular flow voids are preserved. No focal osseous lesion identified. IMPRESSION: 1. No acute intracranial abnormality. 2. Interval punctate non acute small vessel infarcts in the left corona radiata and right occipital lobe. 3. Moderately extensive chronic small vessel ischemic disease in the cerebral white matter and brainstem. Electronically Signed   By: Sebastian Ache M.D.   On: 03/02/2016 16:01    EKG: Independently reviewed. Sinus rhythm Left anterior fascicular block Abnormal R-wave progression, early transition Left ventricular hypertrophy  Assessment/Plan Principal Problem:   Right sided  weakness Active Problems:   Upper airway cough syndrome   HBP (high blood pressure)   Cluster headaches   TIA (transient ischemic attack)   Diabetes (HCC)   Dyslipidemia   Obesity   Neuropathy (HCC)   Dysrhythmia   1. Right-sided weakness, slurred speech, ataxia/TIA. Post factors include obesity dyslipidemia hypertension diabetes study of same in the past. Symptoms resolved prior to presentation. MRI of the brain without acute intracranial abnormality does show nonacute small vessel infarcts right occipital lobe and left corona radiata. EKG with sinus rhythm, was provided aspirin. Home meds include 81 mg of aspirin daily -Admit to telemetry -Obtain hemoglobin A1c and lipid panel -Obtain 2-D echo MRA head and neck -Carotid Doppler -Chest x-ray -Continue aspirin -Continue home statin -physical therapy -neuro consult  #2. Hypertension. Blood pressure on the low end of normal while in the emergency department. Home medications include metoprolol, Benicar HCT -We'll hold these for now to allow for permissive hypertension -Plan to resume when indicated -Monitor  #3. Diabetes. Glucose 254 on admission. Home medications include oral agents -Hold home meds -Obtain a hemoglobin A1c -Use sliding scale for optimal control  #4. Obesity. BMI 35.6. Patient reports having lost 20 pounds recently through diet control -nutritional consult  #5. Cluster headaches. See above. No complaints of headache -Continue home meds  6. Neuropathy. Appears stable at baseline. -Continue home meds  7. Dysrhythmia. 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 EKG as noted above. No chest pain. Meds include metoprolol -Hold beta blocker for now  #8. Upper airway cough syndrome/obstructive sleep apnea. Patient sees Dr. Sharee Pimple. Home medications include inhalers. Appears stable at baseline. Patient saturation level greater than 90% on room air -Obtain chest x-ray -Into new  home meds -6 have per respiratoryry    DVT prophylaxis: scd  Code Status: full  Family Communication: friends at bedside  Disposition Plan: home hopefully tomorrow  Consults called: dr Roxy Manns neuro  Admission status: obs    Gwenyth Bender [name] [facility]  If 7PM-7AM, please contact night-coverage [email or web address]  Password [email or web address]  03/02/2016, 5:23 PM

## 2016-03-02 NOTE — ED Notes (Signed)
Spoke to Vascular Tech, aware of patient

## 2016-03-03 ENCOUNTER — Observation Stay (HOSPITAL_COMMUNITY): Payer: PPO

## 2016-03-03 DIAGNOSIS — G459 Transient cerebral ischemic attack, unspecified: Secondary | ICD-10-CM | POA: Diagnosis not present

## 2016-03-03 DIAGNOSIS — M6289 Other specified disorders of muscle: Secondary | ICD-10-CM | POA: Diagnosis not present

## 2016-03-03 DIAGNOSIS — I639 Cerebral infarction, unspecified: Secondary | ICD-10-CM | POA: Diagnosis not present

## 2016-03-03 DIAGNOSIS — E1140 Type 2 diabetes mellitus with diabetic neuropathy, unspecified: Secondary | ICD-10-CM

## 2016-03-03 DIAGNOSIS — G629 Polyneuropathy, unspecified: Secondary | ICD-10-CM

## 2016-03-03 LAB — GLUCOSE, CAPILLARY
GLUCOSE-CAPILLARY: 152 mg/dL — AB (ref 65–99)
GLUCOSE-CAPILLARY: 202 mg/dL — AB (ref 65–99)

## 2016-03-03 LAB — HEMOGLOBIN A1C
Hgb A1c MFr Bld: 8.5 % — ABNORMAL HIGH (ref 4.8–5.6)
MEAN PLASMA GLUCOSE: 197 mg/dL

## 2016-03-03 MED ORDER — IOPAMIDOL (ISOVUE-370) INJECTION 76%
INTRAVENOUS | Status: AC
  Administered 2016-03-03: 50 mL
  Filled 2016-03-03: qty 50

## 2016-03-03 MED ORDER — CLOPIDOGREL BISULFATE 75 MG PO TABS: 75.0000 mg | ORAL_TABLET | Freq: Every day | ORAL | 0 refills | Status: AC

## 2016-03-03 NOTE — Care Management Note (Signed)
Case Management Note  Patient Details  Name: [name] [name] MRN: [id number] Date of Birth: [birth year]  Subjective/Objective:                    Action/Plan: Pt discharging home with self care. No further needs per CM.   Expected Discharge Date:                  Expected Discharge Plan:  Home/Self Care  In-House Referral:     Discharge planning Services     Post Acute Care Choice:    Choice offered to:     DME Arranged:    DME Agency:     HH Arranged:    HH Agency:     Status of Service:  Completed, signed off  If discussed at [employer] Length of Stay Meetings, dates discussed:    Additional Comments:  Kermit BaloKelli F Danyella Mcginty, RN 03/03/2016, 5:43 PM

## 2016-03-03 NOTE — Care Management Obs Status (Signed)
MEDICARE OBSERVATION STATUS NOTIFICATION   Patient Details  Name: [name] [name] MRN: [id number] Date of Birth: 01-Feb-1951   Medicare Observation Status Notification Given:  Yes (MRI negative)    Kermit BaloKelli F Temia Debroux, RN 03/03/2016, 4:05 PM

## 2016-03-03 NOTE — Procedures (Signed)
ELECTROENCEPHALOGRAM REPORT  Date of Study: 03/03/2016  Patient's Name: [name] [name] MRN: [id number] Date of Birth: [birth year]  Referring Provider: Ritta SlotMcNeill Kirkpatrick, MD  Clinical History: 65 y.o. female with medical history significant for hypertension, diabetes, dyslipidemia, obesity, TIA presents emergency Department chief complaint right-sided weakness with slurred speech and ataxia.  Medications: enoxaparin (LOVENOX) injection 40 mg    escitalopram (LEXAPRO) tablet 20 mg   folic acid (FOLVITE) tablet 1 mg   gabapentin (NEURONTIN) capsule 300 mg   insulin aspart (novoLOG) injection 0-5 Units   insulin aspart (novoLOG) injection 0-9 Units  L2 LORazepam (ATIVAN) injection 1 mg  L2 LORazepam (ATIVAN) tablet 1 mg   magnesium oxide (MAG-OX) tablet 1,400 mg   multivitamin with minerals tablet 1 tablet   senna-docusate (Senokot-S) tablet 1 tablet   simvastatin (ZOCOR) tablet 20 mg  L3 thiamine (VITAMIN B-1) tablet 100 mg   zolpidem (AMBIEN) tablet 10 mg  Technical Summary: A multichannel digital EEG recording measured by the international 10-20 system with electrodes applied with paste and impedances below 5000 ohms performed in our laboratory with EKG monitoring in an awake and drowsy patient.  Photic stimulation was performed.  The digital EEG was referentially recorded, reformatted, and digitally filtered in a variety of bipolar and referential montages for optimal display.    Description: The patient is awake and drowsy during the recording.  During maximal wakefulness, there is a symmetric, medium voltage 9 to 10 Hz posterior dominant rhythm that attenuates with eye opening.  The record is symmetric.  During drowsiness and sleep, there is an increase in theta slowing of the background.  Vertex waves and symmetric sleep spindles were seen.  Hyperventilation ws not performed.  Photic stimulation did not elicit any abnormalities.  There were no epileptiform discharges or electrographic  seizures seen.    EKG lead was unremarkable.  Impression: This awake and drowsy EEG is normal.    Clinical Correlation: A normal EEG does not exclude a clinical diagnosis of epilepsy.  If further clinical questions remain, prolonged EEG may be helpful.  Clinical correlation is advised.   Shon MilletAdam Nakiea Metzner, DO

## 2016-03-03 NOTE — Progress Notes (Signed)
   03/03/16 1500  OT Visit Information  Last OT Received On 03/03/16  Reason Eval/Treat Not Completed OT screened, no needs identified, will sign off  [facility] Analycia Khokhar, OTR/L  [phone] 03/03/2016

## 2016-03-03 NOTE — Progress Notes (Signed)
Nutrition Brief Note  Patient identified on the Malnutrition Screening Tool (MST) Report  Wt Readings from Last 15 Encounters:  03/02/16 207 lb (93.9 kg)  05/07/15 220 lb (99.8 kg)  12/14/14 220 lb (99.8 kg)  12/02/14 217 lb (98.4 kg)  09/04/14 218 lb (98.9 kg)  08/21/14 217 lb 12.8 oz (98.8 kg)  08/04/14 215 lb (97.5 kg)  11/30/13 214 lb 14.4 oz (97.5 kg)  07/13/13 229 lb (103.9 kg)  06/01/13 224 lb 3.2 oz (101.7 kg)  05/19/13 223 lb (101.2 kg)  12/06/12 236 lb (107 kg)  10/25/12 236 lb (107 kg)  03/03/12 227 lb (103 kg)   Pt reports having a decreased appetite for 2 weeks PTA, eating 50% less than usual; she was not concerned because she wants to lose weight. She states that her appetite has returned to normal and she is eating well here. She reports losing 7 lbs in the past 2 weeks. Pt requested diet education regarding diabetes and her triglycerides.    Lab Results  Component Value Date   HGBA1C 8.5 (H) 03/02/2016   Lab Results  Component Value Date   CHOL 284 (H) 03/03/2016   HDL NOT REPORTED DUE TO HIGH TRIGLYCERIDES 03/03/2016   LDLCALC UNABLE TO CALCULATE IF TRIGLYCERIDE OVER 400 mg/dL 16/10/960408/01/2016   TRIG 5,4091,358 (H) 03/03/2016   CHOLHDL NOT REPORTED DUE TO HIGH TRIGLYCERIDES 03/03/2016    RD provided "Type 2 Diabetes Nutrition Therapy" handout from the Academy of Nutrition and Dietetics. Discussed different food groups and their effects on blood sugar, emphasizing carbohydrate-containing foods. Provided list of carbohydrates and recommended serving sizes of common foods. Discussed importance of controlled and consistent carbohydrate intake throughout the day. Provided examples of ways to balance meals/snacks and encouraged intake of high-fiber, whole grain complex carbohydrates. Provided and discussed "High Triglycerides Nutrition Therapy" handout from Academy of [employer] and Dietetics. Encouraged decreasing intake of red meat, cream, butter, and fried foods. Encouraged  intake of healthy fats, such as olive oil and nuts, and plant based foods such as vegetables, fruits, beans, and whole grains.   Teach back method used. Expect good compliance. Pt plans to stop drinking sweet tea and sugary mixed drinks as well as eat more fruits and vegetables.   Body mass index is 35.53 kg/m. Pt meets criteria for Obesity based on current BMI.  Current diet order is Heart Healthy, patient is consuming approximately 100% of meals at this time. Labs and medications reviewed. No further nutrition interventions warranted at this time. RD contact information provided. If additional nutrition issues arise, please re-consult RD.  [name] [name] RD, LDN Inpatient Clinical Dietitian Pager: [phone] After Hours Pager: [phone]

## 2016-03-03 NOTE — Evaluation (Signed)
Physical Therapy Evaluation & Discharge Patient Details Name: [name] [name] Thorner MRN: [id number] DOB: [birth year] Today's Date: 03/03/2016   History of Present Illness  [name] [name] a 65 y.o.femalewho was admitted following an episode of right-sided weakness, numbness,  and slurred speech. PMH includes HTN, dyslipidemia, DM, depression, asthma, and anxiety. MRI negative for acute changes.  Clinical Impression  Pt admitted with the above. Pt at a decreased fall risk as demonstrated by DGI score of 22. Pt is back to baseline and is safe to return home when medically stable. No follow-up PT recommended, and all education is complete. PT signing off.     Follow Up Recommendations No PT follow up    Equipment Recommendations  None recommended by PT    Recommendations for Other Services       Precautions / Restrictions Precautions Precautions: Fall Precaution Comments: h/o falls Restrictions Weight Bearing Restrictions: No      Mobility  Bed Mobility Overal bed mobility: Modified Independent             General bed mobility comments: HOB elevated, but no difficulties with mobility.  Transfers Overall transfer level: Modified independent Equipment used: None                Ambulation/Gait Ambulation/Gait assistance: Modified independent (Device/Increase time) Ambulation Distance (Feet): 200 Feet Assistive device: None Gait Pattern/deviations: WFL(Within Functional Limits);Step-through pattern   Gait velocity interpretation: at or above normal speed for age/gender    Stairs Stairs: Yes Stairs assistance: Modified independent (Device/Increase time) Stair Management: One rail Right Number of Stairs: 10 General stair comments: Pt reports she uses the railing at home when climbing stairs. Some SOB following stairs, but states this is normal. Recovered normal breathing pattern >901min standing rest.  Wheelchair Mobility    Modified Rankin (Stroke Patients  Only) Modified Rankin (Stroke Patients Only) Pre-Morbid Rankin Score: No symptoms Modified Rankin: No symptoms     Balance Overall balance assessment: Modified Independent                               Standardized Balance Assessment Standardized Balance Assessment : Dynamic Gait Index   Dynamic Gait Index Level Surface: Normal Change in Gait Speed: Normal Gait with Horizontal Head Turns: Mild Impairment Gait with Vertical Head Turns: Normal Gait and Pivot Turn: Normal Step Over Obstacle: Normal Step Around Obstacles: Normal Steps: Mild Impairment Total Score: 22       Pertinent Vitals/Pain Pain Assessment: No/denies pain    Home Living Family/patient expects to be discharged to:: Private residence Living Arrangements: Alone;Non-relatives/Friends (Lives alone, with friends in the area) Available Help at Discharge: Friend(s) Type of Home: House Home Access: Level entry     Home Layout: Two level Home Equipment: None      Prior Function Level of Independence: Independent               Hand Dominance   Dominant Hand: Right    Extremity/Trunk Assessment   Upper Extremity Assessment: Overall WFL for tasks assessed           Lower Extremity Assessment: Overall WFL for tasks assessed         Communication   Communication: No difficulties  Cognition Arousal/Alertness: Awake/alert Behavior During Therapy: WFL for tasks assessed/performed Overall Cognitive Status: Within Functional Limits for tasks assessed  General Comments General comments (skin integrity, edema, etc.): Slight LOB with head turn to L during DGI, but able to self-correct. Pt educated that reported LOB with static standing at home may be due to cardiovasular issues.     Exercises        Assessment/Plan    PT Assessment Patent does not need any further PT services  PT Diagnosis Abnormality of gait   PT Problem List    PT Treatment  Interventions     PT Goals (Current goals can be found in the Care Plan section) Acute Rehab PT Goals Patient Stated Goal: to get healthy again PT Goal Formulation: All assessment and education complete, DC therapy    Frequency     Barriers to discharge        Co-evaluation               End of Session Equipment Utilized During Treatment: Gait belt Activity Tolerance: Patient tolerated treatment well Patient left: in chair;with call bell/phone within reach      Functional Assessment Tool Used: clinical judgement Functional Limitation: Mobility: Walking and moving around Mobility: Walking and Moving Around Current Status [phone]): 0 percent impaired, limited or restricted Mobility: Walking and Moving Around Goal Status [phone]): 0 percent impaired, limited or restricted Mobility: Walking and Moving Around Discharge Status [phone]): 0 percent impaired, limited or restricted    Time: 9147-8295 PT Time Calculation (min) (ACUTE ONLY): 18 min   Charges:   PT Evaluation $PT Eval Moderate Complexity: 1 Procedure     PT G Codes:   PT G-Codes **NOT FOR INPATIENT CLASS** Functional Assessment Tool Used: clinical judgement Functional Limitation: Mobility: Walking and moving around Mobility: Walking and Moving Around Current Status (A2130): 0 percent impaired, limited or restricted Mobility: Walking and Moving Around Goal Status (Q6578): 0 percent impaired, limited or restricted Mobility: Walking and Moving Around Discharge Status [phone]): 0 percent impaired, limited or restricted    [facility] 03/03/2016, 9:59 AM  Park Liter, SPT (student physical therapist) Acute Rehabilitation Services [phone]

## 2016-03-03 NOTE — Progress Notes (Signed)
OT Cancellation Note  Patient Details Name: [name] [name] MRN: [id number] DOB: Feb 18, 1951   Cancelled Treatment:    Reason Eval/Treat Not Completed: Patient at procedure or test/ unavailable  Kurt G Vernon Md PaWARD,HILLARY  Rily Nickey, OTR/L  [phone] 03/03/2016 03/03/2016, 1:32 PM

## 2016-03-03 NOTE — Progress Notes (Signed)
STROKE TEAM PROGRESS NOTE   HISTORY OF PRESENT ILLNESS (per record) [name] [name] is a 65 y.o. female who presents following an episode of right-sided weakness, numbness, slurred speech. She states that she was at [employer], then began having some difficulty using her right hand. She states that she felt numb on the right. She denies tingling. She then noticed that she wasn't saying things well to the right. She tried to stand up and stumbled. Therefore EMS was called, however the symptoms resolve prior to arrival in the ER. She has had 5 similar episodes over the past few weeks. She had no spots in her vision, just lack of vision to the right.  She began having odd symptoms in May, and had several episodes of confusion associated with severe headache which were diagnosis as complicated migraine. Then about 3 weeks ago, she began having episodes similar to the one today. She has had a total of 4 - 5 of these episodes, none of which are associated with headache. She retains consciousness throughout the episode, and the last for up to 3 hours.  She is currently back to baseline. Patient was not administered IV t-PA secondary to resolved symptoms. She was admitted for further evaluation and treatment.   SUBJECTIVE (INTERVAL HISTORY) Patient is stable. No complaints   OBJECTIVE Temp:  [97.6 F (36.4 C)-98.6 F (37 C)] 98.2 F (36.8 C) (08/07 1427) Pulse Rate:  [71-79] 78 (08/07 1427) Cardiac Rhythm: Normal sinus rhythm (08/07 0910) Resp:  [16-18] 16 (08/07 1427) BP: (108-144)/(46-87) 142/70 (08/07 1427) SpO2:  [96 %-98 %] 96 % (08/07 1427)  CBC:  Recent Labs Lab 03/02/16 1330 03/02/16 1337 03/02/16 1804  WBC 9.4  --  8.8  HGB 12.1 12.6 12.2  HCT 35.7* 37.0 37.5  MCV 95.5  --  97.9  PLT 191  --  191    Basic Metabolic Panel:  Recent Labs Lab 03/02/16 1337 03/02/16 1804  NA 136  --   K 3.8  --   CL 98*  --   GLUCOSE 254*  --   BUN 16  --   CREATININE 0.70 0.86    Lipid  Panel:    Component Value Date/Time   CHOL 284 (H) 03/03/2016 0428   TRIG 1,358 (H) 03/03/2016 0428   HDL NOT REPORTED DUE TO HIGH TRIGLYCERIDES 03/03/2016 0428   CHOLHDL NOT REPORTED DUE TO HIGH TRIGLYCERIDES 03/03/2016 0428   VLDL UNABLE TO CALCULATE IF TRIGLYCERIDE OVER 400 mg/dL 14/78/2956 2130   LDLCALC UNABLE TO CALCULATE IF TRIGLYCERIDE OVER 400 mg/dL 86/57/8469 6295   MWUX3K:  Lab Results  Component Value Date   HGBA1C 8.5 (H) 03/02/2016   Urine Drug Screen:    Component Value Date/Time   LABOPIA NONE DETECTED 03/02/2016 1455   COCAINSCRNUR NONE DETECTED 03/02/2016 1455   LABBENZ NONE DETECTED 03/02/2016 1455   AMPHETMU NONE DETECTED 03/02/2016 1455   THCU NONE DETECTED 03/02/2016 1455   LABBARB NONE DETECTED 03/02/2016 1455      IMAGING  Dg Chest 2 View  Result Date: 03/02/2016 CLINICAL DATA:  Right-sided weakness EXAM: CHEST  2 VIEW COMPARISON:  08/21/2014 FINDINGS: The heart size and mediastinal contours are within normal limits. Both lungs are clear. The visualized skeletal structures are unremarkable. IMPRESSION: No active cardiopulmonary disease. Electronically Signed   By: Alcide Clever M.D.   On: 03/02/2016 17:15   Ct Angio Neck W Or Wo Contrast  Result Date: 03/03/2016 CLINICAL DATA:  Follow-up stroke symptoms today. RIGHT-sided weakness.  History of diabetes, hypertension. EXAM: CT ANGIOGRAPHY NECK TECHNIQUE: Multidetector CT imaging of the neck was performed using the standard protocol during bolus administration of intravenous contrast. Multiplanar CT image reconstructions and MIPs were obtained to evaluate the vascular anatomy. Carotid stenosis measurements (when applicable) are obtained utilizing NASCET criteria, using the distal internal carotid diameter as the denominator. CONTRAST:  50 cc Isovue 370 COMPARISON:  MRA head March 02, 2006 and CT angiogram of the neck Dec 14, 2014 FINDINGS: Aortic arch: Normal appearance of the thoracic arch, normal branch pattern.  Mild calcific atherosclerosis of the aortic arch. The origins of the innominate, left Common carotid artery and subclavian artery are widely patent. Right carotid system: Common carotid artery is widely patent, coursing in a straight line fashion. Retropharyngeal course of RIGHT Common carotid artery in a proximal internal carotid artery. Normal appearance of the carotid bifurcation without hemodynamically significant stenosis by NASCET criteria, moderate eccentric calcific atherosclerosis. Normal appearance of the included internal carotid artery. Left carotid system: Common carotid artery is widely patent, coursing in a straight line fashion. Normal appearance of the carotid bifurcation without hemodynamically significant stenosis by NASCET criteria. Normal appearance of the included internal carotid artery. Vertebral arteries:Left vertebral artery is dominant. Normal appearance of the vertebral arteries, which appear widely patent. Skeleton: No acute osseous process though bone windows have not been submitted. Multilevel severe facet arthropathy moderate to severe bilateral C3-4, C4-5, C5-6 and LEFT C6-7 neural foraminal narrowing. No acute osseous process. Other neck: Soft tissues of the neck are nonacute though, not tailored for evaluation. Round 10 mm nodule superficial lobe of RIGHT parotid gland was 8 mm. Similar 8 mm RIGHT level IIa lymph node. IMPRESSION: Atherosclerosis without hemodynamically significant stenosis or acute vascular process; stable appearance from Dec 14, 2014. Enlarging 10 mm nodule RIGHT parotid gland. This would be better characterized on MRI of the neck, alternatively, histopathologic sampling. Electronically Signed   By: Awilda Metroourtnay  Bloomer M.D.   On: 03/03/2016 02:23   Mr Brain Wo Contrast  Result Date: 03/02/2016 CLINICAL DATA:  Right-sided weakness. Slurred speech and difficulty walking after eating lunch today. Five similar episodes over the past 5 months, each lasting  approximately 1 hour. EXAM: MRI HEAD WITHOUT CONTRAST TECHNIQUE: Multiplanar, multiecho pulse sequences of the brain and surrounding structures were obtained without intravenous contrast. COMPARISON:  01/05/2016 FINDINGS: There are 2 punctate foci of hyperintense trace diffusion weighted signal, 1 in the left corona radiata and 1 in the right occipital white matter without corresponding reduced ADC. The focus in the left corona radiata has an appearance on standard T1 and T2 sequences of a late subacute to chronic infarct, new from the prior MRI. No restricted diffusion is identified elsewhere to indicate acute infarct. There is no evidence of intracranial hemorrhage, mass, midline shift, or extra-axial fluid collection. Ventricles and sulci are normal. Cerebral white matter T2 hyperintensities are confluent in the periventricular regions and unchanged from the prior study, as is extensive T2 signal abnormality in the pons. Orbits are unremarkable. Paranasal sinuses and mastoid air cells are clear. Major intracranial vascular flow voids are preserved. No focal osseous lesion identified. IMPRESSION: 1. No acute intracranial abnormality. 2. Interval punctate non acute small vessel infarcts in the left corona radiata and right occipital lobe. 3. Moderately extensive chronic small vessel ischemic disease in the cerebral white matter and brainstem. Electronically Signed   By: Sebastian AcheAllen  Grady M.D.   On: 03/02/2016 16:01   Mr Maxine GlennMra Head/brain ZOWo Cm  Result Date: 03/02/2016  CLINICAL DATA:  Sudden onset right facial weakness. EXAM: MRA HEAD WITHOUT CONTRAST TECHNIQUE: Angiographic images of the Circle of Willis were obtained using MRA technique without intravenous contrast. COMPARISON:  CTA head neck 12/14/2014 FINDINGS: Mild left vertebral artery dominance. Duplicated left superior cerebellar artery with otherwise standard vertebrobasilar branching. 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 No  major branch occlusion or flow limiting stenosis. Negative for aneurysm or vessel beading. IMPRESSION: Negative intracranial MRA. Electronically Signed   By: Marnee Spring M.D.   On: 03/02/2016 18:27     PHYSICAL EXAM Pleasant middle aged lady not in distress. . Afebrile. Head is nontraumatic. Neck is supple without bruit.    Cardiac exam no murmur or gallop. Lungs are clear to auscultation. Distal pulses are well felt. Neurological Exam ;  Awake  Alert oriented x 3. Normal speech and language.eye movements full without nystagmus.fundi were not visualized. Vision acuity and fields appear normal. Hearing is normal. Palatal movements are normal. Face symmetric. Tongue midline. Normal strength, tone, reflexes and coordination. Normal sensation. Gait deferred.  ASSESSMENT/PLAN [name] [name] is a 65 y.o. female with history of hypertension, diabetes, dyslipidemia, obesity, TIA presenting with transient right-sided weakness and numbness. She did not receive IV t-PA due to resolved symptoms.   Left brainTIA likely small vessel disease  MRI  No acute stroke. Interval non-acute left corona radiata and right occipital lobe infarcts. Small vessel disease.  MRA Unremarkable   CTA neck  Atherosclerosis without hemodynamically significant stenosis. Enlarging 10 mm nodule in right parotid gland  2D Echo  pending   EEG pending   LDL unable to calculate  HgbA1c 8.5  Lovenox 40 mg sq daily for VTE prophylaxis  Diet Heart Room service appropriate? Yes; Fluid consistency: Thin  Diet - low sodium heart healthy  aspirin 81 mg daily prior to admission, now on aspirin 325 mg daily  Patient counseled to be compliant with her antithrombotic medications  Ongoing aggressive stroke risk factor management  Therapy recommendations:  No therapy needs  Disposition:  Return home  Hypertension  Stable Long-term BP goal normotensive  Hyperlipidemia  Home meds:  Zocor 20, resumed in  hospital  LDL unable to calculate, goal < 70  Continue statin at discharge  Diabetes  HgbA1c 8.5, goal < 7.0  Uncontrolled  Other Stroke Risk Factors  Advanced age  ETOH use, advised to drink no more than 1 drink(s) a day  Obesity, Body mass index is 35.53 kg/m., recommend weight loss, diet and exercise as appropriate   Hx stroke/TIA - incremental infarcts last MRI in May  Obstructive sleep apnea, on CPAP at home  Other Active Problems  Enlarging 10 mm nodule in right parotid gland  Hospital day # 0 I have personally examined this patient, reviewed notes, independently viewed imaging studies, participated in medical decision making and plan of care. I have made any additions or clarifications directly to the above note.     Delia Heady, MD Medical Director [facility] Pager: [phone] 03/03/2016 6:43 PM     To contact Stroke Continuity provider, please refer to [email or web address]. After hours, contact General Neurology

## 2016-03-03 NOTE — Discharge Summary (Signed)
Physician Discharge Summary  [name] [name] ZOX:[id number] DOB: [birth year] DOA: 03/02/2016  PCP: Georgann Housekeeper, MD  Admit date: 03/02/2016 Discharge date: 03/03/2016  Admitted From:  Home Disposition:  Home  Recommendations for Outpatient Follow-up:  1. Follow up with PCP in 1-2 weeks 2. Patient has been changed from aspirin to Plavix, for stroke prophylaxis 3. Echocardiogram report still pending 4. Please follow on 10 mm nodule right parotid gland. (MRI or histologic sampling)  Home Health: no Equipment/Devices: no  Discharge Condition: stable CODE STATUS: full Diet recommendation: Heart Healthy / Carb Modified  Brief/Interim Summary: This is a 65 year old female who presents to the hospital with the chief complaint of right-sided weakness, numbness, ataxia. Patient abruptly felt numbness on the right side of her face, she had difficulty using her right hand, and she was noted to have slurred speech plus ambulatory dysfunction. Apparently she had similar symptoms for last 4 weeks. On initial physical examination her blood pressure was 131/73, heart rate 80, respiratory rate 18, temperature 98.6, oxygen saturation 96%. Her oral mucosa was moist, her lungs were clear to auscultation, heart S1-S2 present rhythmic, abdomen soft nontender, lower extremity no edema, neurologically cranial nerves II-12 grossly intact, strength 5 over 5 all 4 extremities. Her serum sodium was 136, potassium 3.8, creatinine 0.70, white count 8.8, hemoglobin 12.2, hematocrit 37.5, platelet count 191. Her head CT was negative for acute abnormalities. Her EKG was normal sinus rhythm, left axis deviation with left to fascicular block.  Patient was admitted to hospital with the working diagnosis of focal neurologic deficit to rule out transitory ischemic attack.  1. Transitory ischemic attack. Patient had further workup with an MRI which show interval punctate nonacute small vessel infarcts in the left corona radiata and right  occipital lobe. Moderately extensive chronic small vessel ischemic disease in the cerebral white matter and brainstem. MRA was negative. CT angiography showed atherosclerosis without hemodynamic significant stenosis. Patient was seen by neurology with recommendations to change aspirin for Plavix. Continue statin. Echocardiogram resultst still pending. Patient was seen by physical therapy and occupational therapy. No follow-up recommended.  2. Hypertension. Patient will resume Toprol and Benicar, per his home regimen.  3. Diabetes mellitus type 2. We'll continue metformin, Januvia, per home regimen.  Her glucose remained between 189 and 202.  4. Cluster headaches. Patient was continued on Lexapro, gabapentin, alprazolam, Cymbalta, Relpax, tizanidine, topiramate, per her home regimen  Enlarging 10 mm nodule in the right parotid gland will need to have further workup as an outpatient, either MRI or histopathologic sampling.  Discharge Diagnoses:  Principal Problem:   Right sided weakness Active Problems:   Upper airway cough syndrome   HBP (high blood pressure)   Cluster headaches   TIA (transient ischemic attack)   Diabetes (HCC)   Dyslipidemia   Obesity   Neuropathy (HCC)   Dysrhythmia    Discharge Instructions  Discharge Instructions    Diet - low sodium heart healthy    Complete by:  As directed   Discharge instructions    Complete by:  As directed   Follow with primary care in 7days   Increase activity slowly    Complete by:  As directed       Medication List    STOP taking these medications   aspirin EC 81 MG tablet     TAKE these medications   ALPRAZolam 0.5 MG tablet Commonly known as:  XANAX Take 0.25-0.5 mg by mouth daily as needed for anxiety.   clopidogrel 75 MG tablet  Commonly known as:  PLAVIX Take 1 tablet (75 mg total) by mouth daily.   DULoxetine 60 MG capsule Commonly known as:  CYMBALTA Take 60 mg by mouth 2 (two) times daily.   eletriptan 40 MG  tablet Commonly known as:  RELPAX Take 40 mg by mouth daily as needed for migraine. May repeat once in 2 hours if no relief   escitalopram 20 MG tablet Commonly known as:  LEXAPRO Take 20 mg by mouth daily.   gabapentin 300 MG capsule Commonly known as:  NEURONTIN Take 1 capsule (300 mg total) by mouth 3 (three) times daily.   JANUVIA 100 MG tablet Generic drug:  sitaGLIPtin Take 100 mg by mouth every morning.   lidocaine 5 % Commonly known as:  LIDODERM Place 1 patch onto the skin daily. Remove & Discard patch within 12 hours or as directed by MD   MAGNESIUM PO Take 1 tablet by mouth daily.   metFORMIN 500 MG tablet Commonly known as:  GLUCOPHAGE Take 500 mg by mouth 2 (two) times daily with a meal.   METHYL B-12 PO Take 1 tablet by mouth daily.   metoprolol succinate 25 MG 24 hr tablet Commonly known as:  TOPROL-XL Take 12.5 mg by mouth 2 (two) times daily.   olmesartan-hydrochlorothiazide 40-25 MG tablet Commonly known as:  BENICAR HCT Take 0.5 tablets by mouth daily.   simvastatin 20 MG tablet Commonly known as:  ZOCOR Take 20 mg by mouth every evening.   tiZANidine 4 MG tablet Commonly known as:  ZANAFLEX Take 4-6 mg by mouth 3 (three) times daily as needed for muscle spasms.   TROKENDI XR 25 MG Cp24 Generic drug:  Topiramate ER Take 25 mg by mouth daily as needed. For migraine/May repeat once in 2 hours if no relief   VITAMIN D-VITAMIN K PO Take 1 tablet by mouth daily.   zolpidem 10 MG tablet Commonly known as:  AMBIEN Take 10 mg by mouth at bedtime.       Allergies  Allergen Reactions  . Prevacid [Lansoprazole] Hives    Consultations: Neurology  Procedures/Studies: Dg Chest 2 View  Result Date: 03/02/2016 CLINICAL DATA:  Right-sided weakness EXAM: CHEST  2 VIEW COMPARISON:  08/21/2014 FINDINGS: The heart size and mediastinal contours are within normal limits. Both lungs are clear. The visualized skeletal structures are unremarkable.  IMPRESSION: No active cardiopulmonary disease. Electronically Signed   By: Alcide Clever M.D.   On: 03/02/2016 17:15   Ct Angio Neck W Or Wo Contrast  Result Date: 03/03/2016 CLINICAL DATA:  Follow-up stroke symptoms today. RIGHT-sided weakness. History of diabetes, hypertension. EXAM: CT ANGIOGRAPHY NECK TECHNIQUE: Multidetector CT imaging of the neck was performed using the standard protocol during bolus administration of intravenous contrast. Multiplanar CT image reconstructions and MIPs were obtained to evaluate the vascular anatomy. Carotid stenosis measurements (when applicable) are obtained utilizing NASCET criteria, using the distal internal carotid diameter as the denominator. CONTRAST:  50 cc Isovue 370 COMPARISON:  MRA head March 02, 2006 and CT angiogram of the neck Dec 14, 2014 FINDINGS: Aortic arch: Normal appearance of the thoracic arch, normal branch pattern. Mild calcific atherosclerosis of the aortic arch. The origins of the innominate, left Common carotid artery and subclavian artery are widely patent. Right carotid system: Common carotid artery is widely patent, coursing in a straight line fashion. Retropharyngeal course of RIGHT Common carotid artery in a proximal internal carotid artery. Normal appearance of the carotid bifurcation without hemodynamically significant stenosis by NASCET  criteria, moderate eccentric calcific atherosclerosis. Normal appearance of the included internal carotid artery. Left carotid system: Common carotid artery is widely patent, coursing in a straight line fashion. Normal appearance of the carotid bifurcation without hemodynamically significant stenosis by NASCET criteria. Normal appearance of the included internal carotid artery. Vertebral arteries:Left vertebral artery is dominant. Normal appearance of the vertebral arteries, which appear widely patent. Skeleton: No acute osseous process though bone windows have not been submitted. Multilevel severe facet  arthropathy moderate to severe bilateral C3-4, C4-5, C5-6 and LEFT C6-7 neural foraminal narrowing. No acute osseous process. Other neck: Soft tissues of the neck are nonacute though, not tailored for evaluation. Round 10 mm nodule superficial lobe of RIGHT parotid gland was 8 mm. Similar 8 mm RIGHT level IIa lymph node. IMPRESSION: Atherosclerosis without hemodynamically significant stenosis or acute vascular process; stable appearance from Dec 14, 2014. Enlarging 10 mm nodule RIGHT parotid gland. This would be better characterized on MRI of the neck, alternatively, histopathologic sampling. Electronically Signed   By: Awilda Metroourtnay  Bloomer M.D.   On: 03/03/2016 02:23   Mr Brain Wo Contrast  Result Date: 03/02/2016 CLINICAL DATA:  Right-sided weakness. Slurred speech and difficulty walking after eating lunch today. Five similar episodes over the past 5 months, each lasting approximately 1 hour. EXAM: MRI HEAD WITHOUT CONTRAST TECHNIQUE: Multiplanar, multiecho pulse sequences of the brain and surrounding structures were obtained without intravenous contrast. COMPARISON:  01/05/2016 FINDINGS: There are 2 punctate foci of hyperintense trace diffusion weighted signal, 1 in the left corona radiata and 1 in the right occipital white matter without corresponding reduced ADC. The focus in the left corona radiata has an appearance on standard T1 and T2 sequences of a late subacute to chronic infarct, new from the prior MRI. No restricted diffusion is identified elsewhere to indicate acute infarct. There is no evidence of intracranial hemorrhage, mass, midline shift, or extra-axial fluid collection. Ventricles and sulci are normal. Cerebral white matter T2 hyperintensities are confluent in the periventricular regions and unchanged from the prior study, as is extensive T2 signal abnormality in the pons. Orbits are unremarkable. Paranasal sinuses and mastoid air cells are clear. Major intracranial vascular flow voids are  preserved. No focal osseous lesion identified. IMPRESSION: 1. No acute intracranial abnormality. 2. Interval punctate non acute small vessel infarcts in the left corona radiata and right occipital lobe. 3. Moderately extensive chronic small vessel ischemic disease in the cerebral white matter and brainstem. Electronically Signed   By: Sebastian AcheAllen  Grady M.D.   On: 03/02/2016 16:01   Mr Maxine GlennMra Head/brain WJWo Cm  Result Date: 03/02/2016 CLINICAL DATA:  Sudden onset right facial weakness. EXAM: MRA HEAD WITHOUT CONTRAST TECHNIQUE: Angiographic images of the Circle of Willis were obtained using MRA technique without intravenous contrast. COMPARISON:  CTA head neck 12/14/2014 FINDINGS: Mild left vertebral artery dominance. Duplicated left superior cerebellar artery with otherwise standard vertebrobasilar branching. 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 No major branch occlusion or flow limiting stenosis. Negative for aneurysm or vessel beading. IMPRESSION: Negative intracranial MRA. Electronically Signed   By: Marnee SpringJonathon  Watts M.D.   On: 03/02/2016 18:27      Subjective: Patient is feeling better, denies any weakness, denies any nausea or vomiting. No headaches.  Discharge Exam: Vitals:   03/03/16 1036 03/03/16 1427  BP: (!) 144/87 (!) 142/70  Pulse: 79 78  Resp: 18 16  Temp: 97.8 F (36.6 C) 98.2 F (36.8 C)   Vitals:   03/03/16  1610 03/03/16 0520 03/03/16 1036 03/03/16 1427  BP: 135/85 123/78 (!) 144/87 (!) 142/70  Pulse: 74 76 79 78  Resp: Temp: 97.9 F (36.6 C) 97.6 F (36.4 C) 97.8 F (36.6 C) 98.2 F (36.8 C)  TempSrc: Oral Oral Oral Oral  SpO2: 98% 97% 98% 96%  Weight:      Height:        General: Pt is alert, awake, not in acute distress Cardiovascular: RRR, S1/S2 +, no rubs, no gallops Respiratory: CTA bilaterally, no wheezing, no rhonchi Abdominal: Soft, NT, ND, bowel sounds + Extremities: no edema, no cyanosis    The results of  significant diagnostics from this hospitalization (including imaging, microbiology, ancillary and laboratory) are listed below for reference.     Microbiology: No results found for this or any previous visit (from the past 240 hour(s)).   Labs: BNP (last 3 results) No results for input(s): BNP in the last 8760 hours. Basic Metabolic Panel:  Recent Labs Lab 03/02/16 1337 03/02/16 1804  NA 136  --   K 3.8  --   CL 98*  --   GLUCOSE 254*  --   BUN 16  --   CREATININE 0.70 0.86   Liver Function Tests: No results for input(s): AST, ALT, ALKPHOS, BILITOT, PROT, ALBUMIN in the last 168 hours. No results for input(s): LIPASE, AMYLASE in the last 168 hours. No results for input(s): AMMONIA in the last 168 hours. CBC:  Recent Labs Lab 03/02/16 1330 03/02/16 1337 03/02/16 1804  WBC 9.4  --  8.8  HGB 12.1 12.6 12.2  HCT 35.7* 37.0 37.5  MCV 95.5  --  97.9  PLT 191  --  191   Cardiac Enzymes: No results for input(s): CKTOTAL, CKMB, CKMBINDEX, TROPONINI in the last 168 hours. BNP: Invalid input(s): POCBNP CBG:  Recent Labs Lab 03/02/16 2247 03/03/16 0654 03/03/16 1147  GLUCAP 189* 152* 202*   D-Dimer No results for input(s): DDIMER in the last 72 hours. Hgb A1c  Recent Labs  03/02/16 1804  HGBA1C 8.5*   Lipid Profile  Recent Labs  03/03/16 0428  CHOL 284*  HDL NOT REPORTED DUE TO HIGH TRIGLYCERIDES  LDLCALC UNABLE TO CALCULATE IF TRIGLYCERIDE OVER 400 mg/dL  TRIG 9,604*  CHOLHDL NOT REPORTED DUE TO HIGH TRIGLYCERIDES   Thyroid function studies No results for input(s): TSH, T4TOTAL, T3FREE, THYROIDAB in the last 72 hours.  Invalid input(s): FREET3 Anemia work up No results for input(s): VITAMINB12, FOLATE, FERRITIN, TIBC, IRON, RETICCTPCT in the last 72 hours. Urinalysis    Component Value Date/Time   COLORURINE YELLOW 03/04/2012 0947   APPEARANCEUR CLEAR 03/04/2012 0947   LABSPEC 1.025 03/04/2012 0947   PHURINE 5.5 03/04/2012 0947   GLUCOSEU  NEGATIVE 03/04/2012 0947   HGBUR NEGATIVE 03/04/2012 0947   BILIRUBINUR NEGATIVE 03/04/2012 0947   KETONESUR NEGATIVE 03/04/2012 0947   PROTEINUR NEGATIVE 03/04/2012 0947   UROBILINOGEN 0.2 03/04/2012 0947   NITRITE NEGATIVE 03/04/2012 0947   LEUKOCYTESUR NEGATIVE 03/04/2012 0947   Sepsis Labs Invalid input(s): PROCALCITONIN,  WBC,  LACTICIDVEN Microbiology No results found for this or any previous visit (from the past 240 hour(s)).   Time coordinating discharge: 45 minutes  SIGNED:   Coralie Keens, MD  [facility] 03/03/2016, 5:27 PM Pager   If 7PM-7AM, please contact night-coverage [email or web address] Password [email or web address]

## 2016-03-03 NOTE — Progress Notes (Signed)
EEG Completed; Results Pending  

## 2016-03-04 LAB — LIPID PANEL
CHOLESTEROL: 284 mg/dL — AB (ref 0–200)
LDL Cholesterol: UNDETERMINED mg/dL (ref 0–99)
Triglycerides: 1358 mg/dL — ABNORMAL HIGH (ref <150)
VLDL: UNDETERMINED mg/dL (ref 0–40)

## 2016-03-07 DIAGNOSIS — G459 Transient cerebral ischemic attack, unspecified: Secondary | ICD-10-CM | POA: Diagnosis not present

## 2016-03-07 DIAGNOSIS — E1141 Type 2 diabetes mellitus with diabetic mononeuropathy: Secondary | ICD-10-CM | POA: Diagnosis not present

## 2016-03-07 DIAGNOSIS — E1165 Type 2 diabetes mellitus with hyperglycemia: Secondary | ICD-10-CM | POA: Diagnosis not present

## 2016-03-17 DIAGNOSIS — H2513 Age-related nuclear cataract, bilateral: Secondary | ICD-10-CM | POA: Diagnosis not present

## 2016-03-17 DIAGNOSIS — H31092 Other chorioretinal scars, left eye: Secondary | ICD-10-CM | POA: Diagnosis not present

## 2016-03-17 DIAGNOSIS — H4312 Vitreous hemorrhage, left eye: Secondary | ICD-10-CM | POA: Diagnosis not present

## 2016-03-17 DIAGNOSIS — H43392 Other vitreous opacities, left eye: Secondary | ICD-10-CM | POA: Diagnosis not present

## 2016-03-17 DIAGNOSIS — I639 Cerebral infarction, unspecified: Secondary | ICD-10-CM | POA: Diagnosis not present

## 2016-05-15 DIAGNOSIS — G458 Other transient cerebral ischemic attacks and related syndromes: Secondary | ICD-10-CM | POA: Diagnosis not present

## 2016-05-15 DIAGNOSIS — G4700 Insomnia, unspecified: Secondary | ICD-10-CM | POA: Diagnosis not present

## 2016-05-15 DIAGNOSIS — G43109 Migraine with aura, not intractable, without status migrainosus: Secondary | ICD-10-CM | POA: Diagnosis not present

## 2016-05-28 DIAGNOSIS — M25551 Pain in right hip: Secondary | ICD-10-CM | POA: Diagnosis not present

## 2016-07-25 DIAGNOSIS — M79672 Pain in left foot: Secondary | ICD-10-CM | POA: Diagnosis not present

## 2016-10-08 ENCOUNTER — Emergency Department (HOSPITAL_COMMUNITY)
Admission: EM | Admit: 2016-10-08 | Discharge: 2016-10-08 | Disposition: A | Discharge status: Home / Self Care | Payer: PPO | Attending: Emergency Medicine | Admitting: Emergency Medicine

## 2016-10-08 ENCOUNTER — Emergency Department (HOSPITAL_COMMUNITY): Payer: PPO

## 2016-10-08 ENCOUNTER — Encounter (HOSPITAL_COMMUNITY): Payer: Self-pay

## 2016-10-08 DIAGNOSIS — R471 Dysarthria and anarthria: Secondary | ICD-10-CM | POA: Insufficient documentation

## 2016-10-08 DIAGNOSIS — I10 Essential (primary) hypertension: Secondary | ICD-10-CM | POA: Diagnosis not present

## 2016-10-08 DIAGNOSIS — Z79899 Other long term (current) drug therapy: Secondary | ICD-10-CM | POA: Insufficient documentation

## 2016-10-08 DIAGNOSIS — Z8673 Personal history of transient ischemic attack (TIA), and cerebral infarction without residual deficits: Secondary | ICD-10-CM | POA: Diagnosis not present

## 2016-10-08 DIAGNOSIS — E1140 Type 2 diabetes mellitus with diabetic neuropathy, unspecified: Secondary | ICD-10-CM | POA: Insufficient documentation

## 2016-10-08 DIAGNOSIS — R791 Abnormal coagulation profile: Secondary | ICD-10-CM | POA: Insufficient documentation

## 2016-10-08 DIAGNOSIS — Z7984 Long term (current) use of oral hypoglycemic drugs: Secondary | ICD-10-CM | POA: Diagnosis not present

## 2016-10-08 DIAGNOSIS — I6789 Other cerebrovascular disease: Secondary | ICD-10-CM | POA: Diagnosis not present

## 2016-10-08 DIAGNOSIS — R4781 Slurred speech: Secondary | ICD-10-CM | POA: Diagnosis not present

## 2016-10-08 DIAGNOSIS — J45909 Unspecified asthma, uncomplicated: Secondary | ICD-10-CM | POA: Diagnosis not present

## 2016-10-08 DIAGNOSIS — G43109 Migraine with aura, not intractable, without status migrainosus: Secondary | ICD-10-CM | POA: Diagnosis not present

## 2016-10-08 DIAGNOSIS — R29810 Facial weakness: Secondary | ICD-10-CM | POA: Diagnosis not present

## 2016-10-08 DIAGNOSIS — R29818 Other symptoms and signs involving the nervous system: Secondary | ICD-10-CM | POA: Diagnosis not present

## 2016-10-08 LAB — I-STAT CHEM 8, ED
BUN: 18 mg/dL (ref 6–20)
CALCIUM ION: 1.11 mmol/L — AB (ref 1.15–1.40)
CHLORIDE: 103 mmol/L (ref 101–111)
Creatinine, Ser: 0.8 mg/dL (ref 0.44–1.00)
GLUCOSE: 236 mg/dL — AB (ref 65–99)
HCT: 30 % — ABNORMAL LOW (ref 36.0–46.0)
HEMOGLOBIN: 10.2 g/dL — AB (ref 12.0–15.0)
POTASSIUM: 4.1 mmol/L (ref 3.5–5.1)
SODIUM: 138 mmol/L (ref 135–145)
TCO2: 27 mmol/L (ref 0–100)

## 2016-10-08 LAB — COMPREHENSIVE METABOLIC PANEL
ALBUMIN: 3.5 g/dL (ref 3.5–5.0)
ALT: 22 U/L (ref 14–54)
ANION GAP: 9 (ref 5–15)
AST: 23 U/L (ref 15–41)
Alkaline Phosphatase: 90 U/L (ref 38–126)
BUN: 15 mg/dL (ref 6–20)
CALCIUM: 8.9 mg/dL (ref 8.9–10.3)
CO2: 24 mmol/L (ref 22–32)
Chloride: 104 mmol/L (ref 101–111)
Creatinine, Ser: 0.8 mg/dL (ref 0.44–1.00)
GFR calc non Af Amer: >60 mL/min (ref >60)
GLUCOSE: 237 mg/dL — AB (ref 65–99)
Potassium: 4 mmol/L (ref 3.5–5.1)
SODIUM: 137 mmol/L (ref 135–145)
TOTAL PROTEIN: 6.5 g/dL (ref 6.5–8.1)
Total Bilirubin: 0.7 mg/dL (ref 0.3–1.2)

## 2016-10-08 LAB — CBC
HCT: 30.9 % — ABNORMAL LOW (ref 36.0–46.0)
Hemoglobin: 10.5 g/dL — ABNORMAL LOW (ref 12.0–15.0)
MCH: 31.1 pg (ref 26.0–34.0)
MCHC: 34 g/dL (ref 30.0–36.0)
MCV: 91.4 fL (ref 78.0–100.0)
PLATELETS: 206 10*3/uL (ref 150–400)
RBC: 3.38 MIL/uL — ABNORMAL LOW (ref 3.87–5.11)
RDW: 13.9 % (ref 11.5–15.5)
WBC: 6.8 10*3/uL (ref 4.0–10.5)

## 2016-10-08 LAB — DIFFERENTIAL
Basophils Absolute: 0 10*3/uL (ref 0.0–0.1)
Basophils Relative: 0 %
EOS ABS: 0.2 10*3/uL (ref 0.0–0.7)
EOS PCT: 3 %
Lymphocytes Relative: 42 %
Lymphs Abs: 2.8 10*3/uL (ref 0.7–4.0)
Monocytes Absolute: 0.3 10*3/uL (ref 0.1–1.0)
Monocytes Relative: 5 %
NEUTROS PCT: 50 %
Neutro Abs: 3.5 10*3/uL (ref 1.7–7.7)

## 2016-10-08 LAB — I-STAT TROPONIN, ED: Troponin i, poc: 0 ng/mL (ref 0.00–0.08)

## 2016-10-08 LAB — APTT: aPTT: 28 seconds (ref 24–36)

## 2016-10-08 LAB — CBG MONITORING, ED: GLUCOSE-CAPILLARY: 197 mg/dL — AB (ref 65–99)

## 2016-10-08 LAB — PROTIME-INR
INR: 1.13
PROTHROMBIN TIME: 14.5 s (ref 11.4–15.2)

## 2016-10-08 NOTE — ED Provider Notes (Signed)
[facility] DEPT Provider Note   CSN: [id number] Arrival date & time: 10/08/16  1043   An emergency department physician performed an initial assessment on this suspected stroke patient at 36.  History   Chief Complaint Chief Complaint  Patient presents with  . Aphasia    HPI [name] [name] is a 66 y.o. female. Patient presents to the emergency department with acute onset development of slurred speech approximately 940 this morning.  Patient has a history of TIA.  She denies weakness of her arms or legs.  He presented to the ER and a code stroke was called.  Patient went to CT scan rapidly which demonstrated no acute abnormalities.  She was seen by neurology who is recommending MRI.  She does have a history of headaches and has mild headache at this time.  No vomiting.  No recent head injury or trauma.  No recent illness.  No fevers or chills.  No neck pain or stiffness.  Denies chest pain abdominal pain.  No weakness of her lower extremity as.  She's compliant with her medications including her Plavix.   The history is provided by the patient and medical records.    Past Medical History:  Diagnosis Date  . Anxiety   . Anxiety   . Asthma    no inhaler use  . Bronchitis   . Depression   . Depression   . Diabetes mellitus   . Difficult intubation    previous intubations in 2012 with 7.0ETT using MAC 3 or 4; on 11/24/13 unable to intubate using Miller 3 followed by glidescope attempt at [place]  . Dyslipidemia   . Dysrhythmia 2008   tachycardia/SVT  . GERD (gastroesophageal reflux disease)   . Hypertension   . OSA (obstructive sleep apnea)    uses a CPAP  . Pneumonia   . RLS (restless legs syndrome)   . Rosacea     Patient Active Problem List   Diagnosis Date Noted  . Right sided weakness 03/02/2016  . Dyslipidemia 03/02/2016  . Obesity 03/02/2016  . Neuropathy ([place]) 03/02/2016  . TIA (transient ischemic attack)   . Diabetes ([place])   . OSA (obstructive  sleep apnea)   . Cluster headaches 12/02/2014  . Upper airway cough syndrome 10/25/2012  . HBP (high blood pressure) 10/25/2012  . Dysrhythmia 07/28/2006    Past Surgical History:  Procedure Laterality Date  . CARDIAC ELECTROPHYSIOLOGY MAPPING AND ABLATION     at [facility] ~ 2008  . CARPAL TUNNEL RELEASE Bilateral   . CESAREAN SECTION    . CHOLECYSTECTOMY    . DILATION AND CURETTAGE OF UTERUS    . HYSTEROSCOPY W/D&C  03/04/2012   Procedure: DILATATION AND CURETTAGE /HYSTEROSCOPY;  Surgeon: Maeola Sarah. Landry Mellow, MD;  Location: [facility];  Service: Gynecology;  Laterality: N/A;  . SHOULDER ARTHROSCOPY Left 2012    x 2  . SHOULDER ARTHROSCOPY WITH ROTATOR CUFF REPAIR Right 12/01/2013   Procedure: RIGHT SHOULDER ARTHROSCOPY WITH ROTATOR CUFF REPAIR;  Surgeon: Hessie Dibble, MD;  Location: [facility];  Service: Orthopedics;  Laterality: Right;  . TONSILLECTOMY      OB History    No data available       Home Medications    Prior to Admission medications   Medication Sig Start Date End Date Taking? Authorizing Provider  ALPRAZolam Duanne Moron) 0.5 MG tablet Take 0.25-0.5 mg by mouth daily as needed for anxiety.  01/25/16   Historical Provider, MD  clopidogrel (PLAVIX) 75 MG tablet  Take 1 tablet (75 mg total) by mouth daily. 03/03/16   Mauricio Gerome Apley, MD  DULoxetine (CYMBALTA) 60 MG capsule Take 60 mg by mouth 2 (two) times daily. 12/27/15   Historical Provider, MD  eletriptan (RELPAX) 40 MG tablet Take 40 mg by mouth daily as needed for migraine. May repeat once in 2 hours if no relief 12/27/15   Historical Provider, MD  escitalopram (LEXAPRO) 20 MG tablet Take 20 mg by mouth daily.    Historical Provider, MD  gabapentin (NEURONTIN) 300 MG capsule Take 1 capsule (300 mg total) by mouth 3 (three) times daily. 07/13/13   Tanda Rockers, MD  JANUVIA 100 MG tablet Take 100 mg by mouth every morning. 01/28/16   Historical Provider, MD  lidocaine (LIDODERM) 5 % Place 1 patch onto the skin daily. Remove & Discard  patch within 12 hours or as directed by MD    Historical Provider, MD  MAGNESIUM PO Take 1 tablet by mouth daily.     Historical Provider, MD  metFORMIN (GLUCOPHAGE) 500 MG tablet Take 500 mg by mouth 2 (two) times daily with a meal.    Historical Provider, MD  Methylcobalamin (METHYL B-12 PO) Take 1 tablet by mouth daily.    Historical Provider, MD  metoprolol succinate (TOPROL-XL) 25 MG 24 hr tablet Take 12.5 mg by mouth 2 (two) times daily.    Historical Provider, MD  olmesartan-hydrochlorothiazide (BENICAR HCT) 40-25 MG per tablet Take 0.5 tablets by mouth daily.    Historical Provider, MD  simvastatin (ZOCOR) 20 MG tablet Take 20 mg by mouth every evening.    Historical Provider, MD  tiZANidine (ZANAFLEX) 4 MG tablet Take 4-6 mg by mouth 3 (three) times daily as needed for muscle spasms.  12/27/15   Historical Provider, MD  Topiramate ER (TROKENDI XR) 25 MG CP24 Take 25 mg by mouth daily as needed. For migraine/May repeat once in 2 hours if no relief 12/27/15   Historical Provider, MD  VITAMIN D-VITAMIN K PO Take 1 tablet by mouth daily.    Historical Provider, MD  zolpidem (AMBIEN) 10 MG tablet Take 10 mg by mouth at bedtime.    Historical Provider, MD    Family History Family History  Problem Relation Age of Onset  . Breast cancer Mother   . Emphysema Mother     was a smoker  . Cancer Mother   . Heart disease Mother   . Hyperlipidemia Mother     Social History Social History  Substance Use Topics  . Smoking status: Never Smoker  . Smokeless tobacco: Never Used  . Alcohol use 3.5 oz/week    7 drink(s) per week     Comment: weekly     Allergies   Prevacid [lansoprazole]   Review of Systems Review of Systems  All other systems reviewed and are negative.    Physical Exam Updated Vital Signs BP 130/78   Pulse 66   Resp 20   Ht _0  (1.626 m)   Wt 201 lb 8 oz (91.4 kg)   LMP 03/03/2012   SpO2 91%   BMI 34.59 kg/m   Physical Exam  Constitutional: She is oriented  to person, place, and time. She appears well-developed and well-nourished. No distress.  HENT:  Head: Normocephalic and atraumatic.  Eyes: EOM are normal. Pupils are equal, round, and reactive to light.  Neck: Normal range of motion.  Cardiovascular: Normal rate, regular rhythm and normal heart sounds.   Pulmonary/Chest: Effort normal and breath  sounds normal.  Abdominal: Soft. She exhibits no distension. There is no tenderness.  Musculoskeletal: Normal range of motion.  Neurological: She is alert and oriented to person, place, and time.  5/5 strength in major muscle groups of  bilateral upper and lower extremities. Speech normal. No facial asymetry.   Skin: Skin is warm and dry.  Psychiatric: She has a normal mood and affect. Judgment normal.  Nursing note and vitals reviewed.    ED Treatments / Results  Labs (all labs ordered are listed, but only abnormal results are displayed) Labs Reviewed  CBC - Abnormal; Notable for the following:       Result Value   RBC 3.38 (*)    Hemoglobin 10.5 (*)    HCT 30.9 (*)    All other components within normal limits  COMPREHENSIVE METABOLIC PANEL - Abnormal; Notable for the following:    Glucose, Bld 237 (*)    All other components within normal limits  CBG MONITORING, ED - Abnormal; Notable for the following:    Glucose-Capillary 197 (*)    All other components within normal limits  I-STAT CHEM 8, ED - Abnormal; Notable for the following:    Glucose, Bld 236 (*)    Calcium, Ion 1.11 (*)    Hemoglobin 10.2 (*)    HCT 30.0 (*)    All other components within normal limits  PROTIME-INR  APTT  DIFFERENTIAL  I-STAT TROPOININ, ED    EKG  EKG Interpretation  Date/Time:  Wednesday October 08 2016 11:03:25 EDT Ventricular Rate:  64 PR Interval:    QRS Duration: 99 QT Interval:  411 QTC Calculation: 424 R Axis:   -43 Text Interpretation:  Sinus rhythm Left anterior fascicular block Abnormal R-wave progression, late transition Left  ventricular hypertrophy Nonspecific T abnormalities, diffuse leads No significant change was found Confirmed by Rustin Erhart  MD, Lennette Bihari ([id number]) on 10/08/2016 11:06:06 AM Also confirmed by Venora Maples  MD, Lennette Bihari ([id number]), editor Drema Pry [phone])  on 10/08/2016 11:23:32 AM       Radiology Mr Brain Wo Contrast  Result Date: 10/08/2016 CLINICAL DATA:  66 year old female with Slurred speech, dysarthria. Mild left facial droop. Headache. EXAM: MRI HEAD WITHOUT CONTRAST TECHNIQUE: Multiplanar, multiecho pulse sequences of the brain and surrounding structures were obtained without intravenous contrast. COMPARISON:  Head CT without contrast 1050 hours today. Brain MRI 03/02/2016 and earlier. FINDINGS: Brain: No restricted diffusion to suggest acute infarction. No midline shift, mass effect, evidence of mass lesion, ventriculomegaly, extra-axial collection or acute intracranial hemorrhage. Cervicomedullary junction and pituitary are within normal limits. Patchy and confluent chronic cerebral white matter T2 and FLAIR hyperintensity appears stable since 2017. Similar Patchy and confluent T2 hyperintensity in the pons is stable. No cortical encephalomalacia identified. No chronic cerebral blood products identified. There is some deep white matter capsule involvement, but the deep gray matter nuclei and cerebellum remain normal. Vascular: Major intracranial vascular flow voids are stable since 2017. Mild generalized intracranial artery dolichoectasia. Skull and upper cervical spine: Negative. Visualized bone marrow signal is within normal limits. Sinuses/Orbits: Normal orbits soft tissues. Visualized paranasal sinuses and mastoids are stable and well pneumatized. Other: Visible internal auditory structures appear normal. An oval 10 mm T2 hyperintense right parotid gland nodule is is stable since 2016, compatible with a small benign cyst or neoplasm, of doubtful significance. IMPRESSION: 1.  No acute intracranial  abnormality. 2. Stable chronically advanced nonspecific signal changes in the cerebral white matter and pons, favor due to chronic small vessel disease. Electronically  Signed   By: Genevie Ann M.D.   On: 10/08/2016 14:37   Ct Head Code Stroke W/o Cm  Result Date: 10/08/2016 CLINICAL DATA:  Code stroke. Acute presentation with slurred speech. EXAM: CT HEAD WITHOUT CONTRAST TECHNIQUE: Contiguous axial images were obtained from the base of the skull through the vertex without intravenous contrast. COMPARISON:  03/03/2016 FINDINGS: Brain: No acute finding by CT. Extensive chronic small-vessel ischemic changes affecting the cerebral hemispheric deep and subcortical white matter. Chronic small-vessel changes affecting the pons. No mass lesion, hemorrhage, hydrocephalus or extra-axial collection. Vascular: There is atherosclerotic calcification of the major vessels at the base of the brain. Skull: Negative Sinuses/Orbits: Clear/normal Other: None significant ASPECTS ([place] Stroke Program Early CT Score) - Ganglionic level infarction (caudate, lentiform nuclei, internal capsule, insula, M1-M3 cortex): 7 - Supraganglionic infarction (M4-M6 cortex): 3 Total score (0-10 with 10 being normal): 10 IMPRESSION: 1. No acute finding by CT. Extensive chronic small vessel ischemic changes throughout the brain as outlined above. 2. ASPECTS is 10. These results were called by telephone at the time of interpretation on 10/08/2016 at 10:57 am to Dr. Wendee Beavers , who verbally acknowledged these results. Electronically Signed   By: Nelson Chimes M.D.   On: 10/08/2016 10:59    Procedures Procedures (including critical care time)  Medications Ordered in ED Medications - No data to display   Initial Impression / Assessment and Plan / ED Course  I have reviewed the triage vital signs and the nursing notes.  Pertinent labs & imaging results that were available during my care of the patient were reviewed by me and considered in my medical  decision making (see chart for details).      Patient is overall well-appearing.  She's had resolution of her symptoms.  MRI is negative.  Likely complex migraine.  Outpatient neurology follow-up.  Patient understands to return to the ER for new or worsening symptoms   Final Clinical Impressions(s) / ED Diagnoses   Final diagnoses:  Dysarthria  Complicated migraine    New Prescriptions New Prescriptions   No medications on file     Jola Schmidt, MD 10/08/16 [phone]

## 2016-10-08 NOTE — Code Documentation (Signed)
66 year old female presents to [facility] via [facility] as code stroke.  Patient reports hx of frequent TIA's which present with slurred speech, facial droop and headache - events normally last few minutes to few hours.  This time the sx have persisted.  On arrival to ED met by stroke team at the bridge - she is alert warm and dry - very mild left facial droop and mild dysarthria - no aphasia.  NIHHS 2. Complains of headache.  To CT scan - Dr. Sallyanne Havers to bedside - code stroke cancelled -suspect complicated migraine.  Handoff to [employer] - to call as needed.

## 2016-10-08 NOTE — Consult Note (Signed)
Neurology Consultation Reason for Consult: code stroke Referring Physician: ED  CC: slurred speech  History is obtained from patient  HPI: [name] [name] is a 66 y.o. female who has had multiple similar episodes in the past and she again comes with same today.  Usually they present relatively abruptly in setting of a headache and only last for minutes to hours.  Last time she came here with this was last august. Workup revealed chronic MRI abnormalities and pt was sent home on plavix. Today at 9:30am she developed these same symptoms, including the headache, which she still has at this time.  Given the repetitive nature of these symptoms with negative previous workups I am not going to treat her with ivTPA as the hx is not c/w stroke.     ROS: A 14 point ROS was performed and is negative except as noted in the HPI.  Past Medical History:  Diagnosis Date  . Anxiety   . Anxiety   . Asthma    no inhaler use  . Bronchitis   . Depression   . Depression   . Diabetes mellitus   . Difficult intubation    previous intubations in 2012 with 7.0ETT using MAC 3 or 4; on 11/24/13 unable to intubate using Miller 3 followed by glidescope attempt at [place]  . Dyslipidemia   . Dysrhythmia 2008   tachycardia/SVT  . GERD (gastroesophageal reflux disease)   . Hypertension   . OSA (obstructive sleep apnea)    uses a CPAP  . Pneumonia   . RLS (restless legs syndrome)   . Rosacea     Family History  Problem Relation Age of Onset  . Breast cancer Mother   . Emphysema Mother     was a smoker  . Cancer Mother   . Heart disease Mother   . Hyperlipidemia Mother     Social History:  reports that she has never smoked. She has never used smokeless tobacco. She reports that she drinks about 3.5 oz of alcohol per week . She reports that she does not use drugs.   Exam: Current vital signs: Ht _0  (1.626 m)   Wt 91.4 kg (201 lb 8 oz)   LMP 03/03/2012   BMI 34.59 kg/m  Vital signs in last  24 hours: Weight:  [91.4 kg (201 lb 8 oz)] 91.4 kg (201 lb 8 oz) (03/14 1059)   Physical Exam  Constitutional: Appears well-developed and well-nourished.  Psych: Affect appropriate to situation Eyes: No scleral injection HENT: No OP obstrucion Head: Normocephalic.  Cardiovascular: Normal rate and regular rhythm.  Respiratory: Effort normal and breath sounds normal to anterior ascultation GI: Soft.  No distension. There is no tenderness.  Skin: WDI  Neuro: Mental Status: Patient is awake, alert, oriented to person, place, month, year, and situation. Patient is able to give a clear and coherent history. No signs of aphasia or neglect Cranial Nerves: II: Visual Fields are full. Pupils are equal, round, and reactive to light.  III,IV, VI: EOMI without ptosis or diploplia.  V: Facial sensation is symmetric to temperature VII: Facial movement is symmetric, perhaps a little less on on left.  VIII: hearing is intact to voice X: slight dysarthria XI: Shoulder shrug is symmetric. XII: tongue is midline without atrophy or fasciculations.  Motor: Tone is normal. Bulk is normal. 5/5 strength was present in all four extremities. Sensory: Sensation is symmetric to light touch and temperature in the arms and legs. Deep Tendon Reflexes:  2+ and symmetric in the biceps and patellae. Plantars: Toes are downgoing bilaterally. Cerebellar: FNF and HKS are intact bilaterally  I have reviewed the CT head personally and agree with report  A/P: Recurrent duysarthria and facial weakness in setting of HAs.  Complicated migraine vs Seizure in differential.  Much less likely TIA or stroke.  I have asked for a brain MRI.  IF negative, the pt can be discharged with presumed dx of complicated migraine with neurology follow up as outpatient to obtain EEG.  Otherwise, please let us know and at that time the pt will be followed by stroke service

## 2016-10-08 NOTE — ED Notes (Signed)
Walked patient to the bathroom patient did great patient is back in the bed resting

## 2016-10-08 NOTE — ED Triage Notes (Signed)
Pt from home with slurred speech. Pt states sudden on set at 0940 this morning. Pt has had 5 TIAs in 2017. Pt speech has improved.

## 2016-10-08 NOTE — ED Notes (Signed)
Patient transported to MRI 

## 2016-10-09 DIAGNOSIS — G43109 Migraine with aura, not intractable, without status migrainosus: Secondary | ICD-10-CM | POA: Diagnosis not present

## 2016-11-24 DIAGNOSIS — G43009 Migraine without aura, not intractable, without status migrainosus: Secondary | ICD-10-CM | POA: Diagnosis not present

## 2016-11-24 DIAGNOSIS — G458 Other transient cerebral ischemic attacks and related syndromes: Secondary | ICD-10-CM | POA: Diagnosis not present

## 2016-12-11 DIAGNOSIS — G43009 Migraine without aura, not intractable, without status migrainosus: Secondary | ICD-10-CM | POA: Diagnosis not present

## 2017-01-13 DIAGNOSIS — G43109 Migraine with aura, not intractable, without status migrainosus: Secondary | ICD-10-CM | POA: Diagnosis not present

## 2017-01-15 DIAGNOSIS — F419 Anxiety disorder, unspecified: Secondary | ICD-10-CM | POA: Diagnosis not present

## 2017-01-15 DIAGNOSIS — G4733 Obstructive sleep apnea (adult) (pediatric): Secondary | ICD-10-CM | POA: Diagnosis not present

## 2017-01-15 DIAGNOSIS — G43109 Migraine with aura, not intractable, without status migrainosus: Secondary | ICD-10-CM | POA: Diagnosis not present

## 2017-01-15 DIAGNOSIS — Z9989 Dependence on other enabling machines and devices: Secondary | ICD-10-CM | POA: Diagnosis not present

## 2017-01-15 DIAGNOSIS — G43009 Migraine without aura, not intractable, without status migrainosus: Secondary | ICD-10-CM | POA: Diagnosis not present

## 2017-01-27 DIAGNOSIS — Z0000 Encounter for general adult medical examination without abnormal findings: Secondary | ICD-10-CM | POA: Diagnosis not present

## 2017-01-27 DIAGNOSIS — Z780 Asymptomatic menopausal state: Secondary | ICD-10-CM | POA: Diagnosis not present

## 2017-01-27 DIAGNOSIS — E782 Mixed hyperlipidemia: Secondary | ICD-10-CM | POA: Diagnosis not present

## 2017-01-27 DIAGNOSIS — E781 Pure hyperglyceridemia: Secondary | ICD-10-CM | POA: Diagnosis not present

## 2017-01-27 DIAGNOSIS — E1142 Type 2 diabetes mellitus with diabetic polyneuropathy: Secondary | ICD-10-CM | POA: Diagnosis not present

## 2017-01-27 DIAGNOSIS — Z1389 Encounter for screening for other disorder: Secondary | ICD-10-CM | POA: Diagnosis not present

## 2017-01-27 DIAGNOSIS — G459 Transient cerebral ischemic attack, unspecified: Secondary | ICD-10-CM | POA: Diagnosis not present

## 2017-01-27 DIAGNOSIS — G43809 Other migraine, not intractable, without status migrainosus: Secondary | ICD-10-CM | POA: Diagnosis not present

## 2017-01-27 DIAGNOSIS — I10 Essential (primary) hypertension: Secondary | ICD-10-CM | POA: Diagnosis not present

## 2017-01-27 DIAGNOSIS — E1141 Type 2 diabetes mellitus with diabetic mononeuropathy: Secondary | ICD-10-CM | POA: Diagnosis not present

## 2017-02-23 DIAGNOSIS — G4733 Obstructive sleep apnea (adult) (pediatric): Secondary | ICD-10-CM | POA: Diagnosis not present

## 2017-02-23 DIAGNOSIS — Z9989 Dependence on other enabling machines and devices: Secondary | ICD-10-CM | POA: Diagnosis not present

## 2017-02-23 DIAGNOSIS — F4323 Adjustment disorder with mixed anxiety and depressed mood: Secondary | ICD-10-CM | POA: Diagnosis not present

## 2017-03-03 DIAGNOSIS — E782 Mixed hyperlipidemia: Secondary | ICD-10-CM | POA: Diagnosis not present

## 2017-03-11 DIAGNOSIS — G4733 Obstructive sleep apnea (adult) (pediatric): Secondary | ICD-10-CM | POA: Diagnosis not present

## 2017-03-11 DIAGNOSIS — G43909 Migraine, unspecified, not intractable, without status migrainosus: Secondary | ICD-10-CM | POA: Diagnosis not present

## 2017-07-07 ENCOUNTER — Other Ambulatory Visit: Payer: Self-pay | Admitting: Pharmacy Technician

## 2017-07-07 NOTE — Patient Outreach (Signed)
[facility] Care Management  07/07/2017  [name] [name] 05-Mar-1951 [id number]  Incoming [employer] EMMI call in reference to medication adherence. HIPAA identifiers verified and verbal consent received. [name] [name] states that she takes Metformin 1000 mg and Januvia 100 mg daily as prescribed. She admits to having affordability issues with Victoza and Januvia. I have referred her to [name] [name], [name] [name] in hopes of helping her to obtain patient assistance through [employer] for [employer].  Daryll Brodrystal Zayvier Caravello, CPhT [facility] [phone]

## 2017-07-10 ENCOUNTER — Other Ambulatory Visit: Payer: Self-pay | Admitting: Pharmacist

## 2017-07-10 NOTE — Patient Outreach (Signed)
Valentina ShaggyMary D Dashner is referred to pharmacist by pharmacy technician Daryll Brodrystal Walker for medication assistance. Left a HIPAA compliant message on the patient's voicemail. If have not heard from patient by next week, will give her another call at that time.  Duanne MoronElisabeth Torah Pinnock, PharmD, [facility] Clinical Pharmacist [facility] Care Management [phone]

## 2017-07-13 ENCOUNTER — Other Ambulatory Visit: Payer: Self-pay | Admitting: Pharmacist

## 2017-07-13 NOTE — Patient Outreach (Signed)
[name] [name] is referred to pharmacist by pharmacy technician [name] [name] for medication assistance. Called and spoke with patient. HIPAA identifiers verified and verbal consent received.  [name] [name] reports that she has been taking both Januvia and Victoza until a month ago. Reports that she has been having difficulty with affording both. Reports that due to difficulty with affordability, she has been off of the Victoza for a couple of months now. Reports that for the last couple of months that she took the medication, she was using medication that she received from a friend who had extra from a discontinued prescription. Counsel patient about the dangers of using a prescription medication that is not hers and advise her against doing this in the future. Patient verbalizes understanding. Patient reports that she is also taking metformin 1000 mg twice daily as directed. Patient reports that her most recent A1C in July was 5.6. Reports that she was still taking both Januvia and Victoza until after that A1C. Patient reports that she has not checked her blood sugar at home in 3 months. Advise patient to resume checking and recording her blood sugar at home as directed by her PCP. Note that Victoza and Januvia both work on the same pathway for lowering blood sugar. [name] [name] reports that she has a office visit scheduled with [name] [name] in the first week of January.  Patient also reports that she has a history of frequent complex migraines. Reports that she is wondering if her atorvastatin could be a trigger for these headaches. Reports that she has been off of the atorvastatin for a few weeks now and has noticed that her migraine frequency has decreased. Reports that she has not told her PCP about having stopped her atorvastatin. Counsel patient about the importance of medication adherence and communicating with her provider. Patient verbalizes understanding. Offer to contact patient's PCP to ask if he would be  willing to have the patient try switching from atorvastatin to rosuvastatin to see if this change offers her any relief from her migraines. Note that per the [employer] website, both rosuvastatin and atorvastatin are tier 1 options for the patient. Patient states that she would appreciate my making this call.   PLAN:  1) Will contact [name] [name]'s office regarding the therapeutic duplication of patient taking both Januvia and Victoza. Will let [name] [name] know that the patient reports having been off of Victoza for the last couple of months. Will let him know that the patient reports difficulty with affording both of these medications, but that we will be happy to assist the patient with completing the paperwork for patient assistance for either medication.   2) Will also ask [name] [name] about switching the patient from atorvastatin 20 mg daily to rosuvastatin 10 mg daily to see if this will help to reduce her migraine frequency.    [name] [name], PharmD, [facility]BCACP Clinical Pharmacist [facility] Care Management [phone]

## 2017-07-13 NOTE — Patient Outreach (Signed)
Leave a message with Joni ReiningNicole, assistant to Dr. 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 Let [name]/Dr. Donette LarryHusain know that the patient reports having been off of Victoza for the last couple of months. Will let him know that the patient reports difficulty with affording both of these medications, but that we will be happy to assist the patient with completing the paperwork for patient assistance for either medication.   Also ask about the provider switching the patient from atorvastatin 20 mg daily to rosuvastatin 10 mg daily to see if this will help to reduce her migraine frequency.  If have not heard back from Dr. Venita SheffieldHusain's office by 07/15/17, will follow up again at that time.  Duanne MoronElisabeth Tylia Ewell, PharmD, [facility] Clinical Pharmacist [facility] Care Management [phone]

## 2017-07-15 ENCOUNTER — Other Ambulatory Visit: Payer: Self-pay | Admitting: Pharmacist

## 2017-07-15 NOTE — Patient Outreach (Addendum)
Leave another message with Joni ReiningNicole, assistant to Dr. 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 Again let [name]/Dr. Donette LarryHusain know that the patient reports having been off of Victoza for the last couple of months. Let him know that the patient reports difficulty with affording both of these medications, but that we will be happy to assist the patient with completing the paperwork for patient assistance for either medication.   Also ask about the provider switching the patient from atorvastatin 20 mg daily to rosuvastatin 10 mg daily to see if this will help to reduce her migraine frequency.   Duanne MoronElisabeth Giselle Brutus, PharmD, [facility] LLCBCACP Clinical Pharmacist [facility] Care Management [phone]

## 2017-07-17 ENCOUNTER — Other Ambulatory Visit: Payer: Self-pay | Admitting: Pharmacist

## 2017-07-17 NOTE — Patient Outreach (Signed)
Call to follow up with [name] regarding medication management. Left a HIPAA compliant message on the patient's voicemail. If have not heard from patient by next week, will give her another call at that time.  Duanne MoronElisabeth Chrishun Scheer, PharmD, [facility]BCACP Clinical Pharmacist [facility] [phone]

## 2017-07-20 ENCOUNTER — Other Ambulatory Visit: Payer: Self-pay | Admitting: Pharmacist

## 2017-07-20 NOTE — Patient Outreach (Signed)
Call to follow up with Valentina ShaggyMary D Kosanke regarding medication management. Outreach attempt #2. Left a HIPAA compliant message on the patient's voicemail. Let patient know that I will be out of the office until 08/03/17.  Will attempt to reach patient again in 2 weeks.  Duanne MoronElisabeth Angelee Bahr, PharmD, [facility] CenterBCACP Clinical Pharmacist [facility] [phone]

## 2017-07-23 DIAGNOSIS — J019 Acute sinusitis, unspecified: Secondary | ICD-10-CM | POA: Diagnosis not present

## 2017-07-31 ENCOUNTER — Other Ambulatory Visit: Payer: Self-pay | Admitting: Pharmacist

## 2017-07-31 NOTE — Patient Outreach (Signed)
Again, leave another message with Joni ReiningNicole, assistant to Dr. 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 Again let [name]/Dr. Donette LarryHusain know that the patient reports having been off of Victoza for the last couple of months. Let him know that the patient reports difficulty with affording both of these medications, but that we will be happy to assist the patient with completing the paperwork for patient assistance for either medication.   Also ask about the provider switching the patient from atorvastatin 20 mg daily to rosuvastatin 10 mg daily to see if this will help to reduce her migraine frequency.  Will also call again today to the patient to follow up.   Duanne MoronElisabeth Quantez Schnyder, PharmD, [facility]BCACP Clinical Pharmacist [facility] [phone]

## 2017-07-31 NOTE — Patient Outreach (Signed)
Call to follow up with [name] [name] regarding medication management. Outreach attempt #3. Left a HIPAA compliant message on the patient's voicemail.   As patient does not answer again today, will send patient outreach letter to attempt contact. If I have not heard back from [name] within 14 days, will close pharmacy episode.  Duanne MoronElisabeth Nivan Melendrez, PharmD, [facility] CenterBCACP Clinical Pharmacist [facility] [phone]

## 2017-07-31 NOTE — Patient Outreach (Signed)
Incoming call from VallejoNicole with Dr. Donette LarryHusain. [name] [name] reports that she called to follow up with [name] [name] a couple of weeks ago following my original messages. Reports that she offered patient an earlier appointment to see Dr. Donette LarryHusain. However, reports that patient declined, wanting to keep appointment in February instead.  [name] [name] reports that Dr. Donette LarryHusain will discuss the switch of atorvastatin to rosuvastatin due to patient's side effect with the patient at this upcoming appointment in February.  Reports that Dr. Donette LarryHusain stopped the patient's Victoza in September as patient reported that she was unable to afford this medication. Reports that patient was continued on Januvia. Let [name] [name] know that we are happy to assist the patient with the patient assistance application for Januvia, but that I have been unable to get back in touch with the patient.  PLAN  Have sent the patient outreach letter to attempt contact. If I have not heard back from [name] within 14 days, will close pharmacy episode.  [name], PharmD, [facility]BC[facility] Clinical Pharmacist [facility] [phone]

## 2017-08-19 ENCOUNTER — Other Ambulatory Visit: Payer: Self-pay | Admitting: Pharmacist

## 2017-08-19 NOTE — Patient Outreach (Signed)
Have made three phone call attempts and sent letter. No response within 14 days. Will now close pharmacy episode.  Gaither Biehn, PharmD, BCACP Clinical Pharmacist [facility] [phone]  

## 2017-08-23 DIAGNOSIS — G43009 Migraine without aura, not intractable, without status migrainosus: Secondary | ICD-10-CM | POA: Diagnosis not present

## 2017-08-24 DIAGNOSIS — G43009 Migraine without aura, not intractable, without status migrainosus: Secondary | ICD-10-CM | POA: Diagnosis not present

## 2017-08-31 DIAGNOSIS — Z8673 Personal history of transient ischemic attack (TIA), and cerebral infarction without residual deficits: Secondary | ICD-10-CM | POA: Diagnosis not present

## 2017-08-31 DIAGNOSIS — I10 Essential (primary) hypertension: Secondary | ICD-10-CM | POA: Diagnosis not present

## 2017-08-31 DIAGNOSIS — R200 Anesthesia of skin: Secondary | ICD-10-CM | POA: Diagnosis not present

## 2017-08-31 DIAGNOSIS — G43009 Migraine without aura, not intractable, without status migrainosus: Secondary | ICD-10-CM | POA: Diagnosis not present

## 2017-09-01 DIAGNOSIS — R51 Headache: Secondary | ICD-10-CM | POA: Diagnosis not present

## 2017-09-11 ENCOUNTER — Other Ambulatory Visit: Payer: Self-pay | Admitting: Internal Medicine

## 2017-09-11 DIAGNOSIS — E1142 Type 2 diabetes mellitus with diabetic polyneuropathy: Secondary | ICD-10-CM | POA: Diagnosis not present

## 2017-09-11 DIAGNOSIS — G4733 Obstructive sleep apnea (adult) (pediatric): Secondary | ICD-10-CM | POA: Diagnosis not present

## 2017-09-11 DIAGNOSIS — Z1239 Encounter for other screening for malignant neoplasm of breast: Secondary | ICD-10-CM | POA: Diagnosis not present

## 2017-09-11 DIAGNOSIS — F322 Major depressive disorder, single episode, severe without psychotic features: Secondary | ICD-10-CM | POA: Diagnosis not present

## 2017-09-11 DIAGNOSIS — I471 Supraventricular tachycardia: Secondary | ICD-10-CM | POA: Diagnosis not present

## 2017-09-11 DIAGNOSIS — Z1231 Encounter for screening mammogram for malignant neoplasm of breast: Secondary | ICD-10-CM

## 2017-09-11 DIAGNOSIS — G43909 Migraine, unspecified, not intractable, without status migrainosus: Secondary | ICD-10-CM | POA: Diagnosis not present

## 2017-09-11 DIAGNOSIS — G459 Transient cerebral ischemic attack, unspecified: Secondary | ICD-10-CM | POA: Diagnosis not present

## 2017-09-11 DIAGNOSIS — E782 Mixed hyperlipidemia: Secondary | ICD-10-CM | POA: Diagnosis not present

## 2017-09-11 DIAGNOSIS — Z1389 Encounter for screening for other disorder: Secondary | ICD-10-CM | POA: Diagnosis not present

## 2017-09-11 DIAGNOSIS — I10 Essential (primary) hypertension: Secondary | ICD-10-CM | POA: Diagnosis not present

## 2017-10-01 ENCOUNTER — Ambulatory Visit
Admission: RE | Admit: 2017-10-01 | Discharge: 2017-10-01 | Disposition: A | Discharge status: Home / Self Care | Payer: PPO | Source: Ambulatory Visit | Attending: Internal Medicine | Admitting: Internal Medicine

## 2017-10-01 DIAGNOSIS — Z1231 Encounter for screening mammogram for malignant neoplasm of breast: Secondary | ICD-10-CM

## 2017-12-15 DIAGNOSIS — I639 Cerebral infarction, unspecified: Secondary | ICD-10-CM | POA: Diagnosis not present

## 2017-12-15 DIAGNOSIS — R55 Syncope and collapse: Secondary | ICD-10-CM | POA: Diagnosis not present

## 2017-12-15 DIAGNOSIS — Z9911 Dependence on respirator [ventilator] status: Secondary | ICD-10-CM | POA: Diagnosis not present

## 2017-12-15 DIAGNOSIS — E785 Hyperlipidemia, unspecified: Secondary | ICD-10-CM | POA: Diagnosis not present

## 2017-12-15 DIAGNOSIS — I119 Hypertensive heart disease without heart failure: Secondary | ICD-10-CM | POA: Diagnosis not present

## 2017-12-15 DIAGNOSIS — J9601 Acute respiratory failure with hypoxia: Secondary | ICD-10-CM | POA: Diagnosis not present

## 2017-12-15 DIAGNOSIS — R9401 Abnormal electroencephalogram [EEG]: Secondary | ICD-10-CM | POA: Diagnosis not present

## 2017-12-15 DIAGNOSIS — J384 Edema of larynx: Secondary | ICD-10-CM | POA: Diagnosis not present

## 2017-12-15 DIAGNOSIS — J811 Chronic pulmonary edema: Secondary | ICD-10-CM | POA: Diagnosis not present

## 2017-12-15 DIAGNOSIS — E1165 Type 2 diabetes mellitus with hyperglycemia: Secondary | ICD-10-CM | POA: Diagnosis not present

## 2017-12-15 DIAGNOSIS — D696 Thrombocytopenia, unspecified: Secondary | ICD-10-CM | POA: Diagnosis not present

## 2017-12-15 DIAGNOSIS — M6281 Muscle weakness (generalized): Secondary | ICD-10-CM | POA: Diagnosis not present

## 2017-12-15 DIAGNOSIS — Z79899 Other long term (current) drug therapy: Secondary | ICD-10-CM | POA: Diagnosis not present

## 2017-12-15 DIAGNOSIS — I499 Cardiac arrhythmia, unspecified: Secondary | ICD-10-CM | POA: Diagnosis not present

## 2017-12-15 DIAGNOSIS — I69115 Cognitive social or emotional deficit following nontraumatic intracerebral hemorrhage: Secondary | ICD-10-CM | POA: Diagnosis not present

## 2017-12-15 DIAGNOSIS — S065X9A Traumatic subdural hemorrhage with loss of consciousness of unspecified duration, initial encounter: Secondary | ICD-10-CM | POA: Diagnosis not present

## 2017-12-15 DIAGNOSIS — R030 Elevated blood-pressure reading, without diagnosis of hypertension: Secondary | ICD-10-CM | POA: Diagnosis not present

## 2017-12-15 DIAGNOSIS — R509 Fever, unspecified: Secondary | ICD-10-CM | POA: Diagnosis not present

## 2017-12-15 DIAGNOSIS — I612 Nontraumatic intracerebral hemorrhage in hemisphere, unspecified: Secondary | ICD-10-CM | POA: Diagnosis not present

## 2017-12-15 DIAGNOSIS — Z7982 Long term (current) use of aspirin: Secondary | ICD-10-CM | POA: Diagnosis not present

## 2017-12-15 DIAGNOSIS — I959 Hypotension, unspecified: Secondary | ICD-10-CM | POA: Diagnosis not present

## 2017-12-15 DIAGNOSIS — F1010 Alcohol abuse, uncomplicated: Secondary | ICD-10-CM | POA: Diagnosis not present

## 2017-12-15 DIAGNOSIS — G43809 Other migraine, not intractable, without status migrainosus: Secondary | ICD-10-CM | POA: Diagnosis not present

## 2017-12-15 DIAGNOSIS — I498 Other specified cardiac arrhythmias: Secondary | ICD-10-CM | POA: Diagnosis not present

## 2017-12-15 DIAGNOSIS — I69111 Memory deficit following nontraumatic intracerebral hemorrhage: Secondary | ICD-10-CM | POA: Diagnosis not present

## 2017-12-15 DIAGNOSIS — I69320 Aphasia following cerebral infarction: Secondary | ICD-10-CM | POA: Diagnosis not present

## 2017-12-15 DIAGNOSIS — R402312 Coma scale, best motor response, none, at arrival to emergency department: Secondary | ICD-10-CM | POA: Diagnosis not present

## 2017-12-15 DIAGNOSIS — N390 Urinary tract infection, site not specified: Secondary | ICD-10-CM | POA: Diagnosis not present

## 2017-12-15 DIAGNOSIS — J690 Pneumonitis due to inhalation of food and vomit: Secondary | ICD-10-CM | POA: Diagnosis not present

## 2017-12-15 DIAGNOSIS — Z743 Need for continuous supervision: Secondary | ICD-10-CM | POA: Diagnosis not present

## 2017-12-15 DIAGNOSIS — I611 Nontraumatic intracerebral hemorrhage in hemisphere, cortical: Secondary | ICD-10-CM | POA: Diagnosis not present

## 2017-12-15 DIAGNOSIS — I619 Nontraumatic intracerebral hemorrhage, unspecified: Secondary | ICD-10-CM | POA: Diagnosis not present

## 2017-12-15 DIAGNOSIS — R9431 Abnormal electrocardiogram [ECG] [EKG]: Secondary | ICD-10-CM | POA: Diagnosis not present

## 2017-12-15 DIAGNOSIS — Z8673 Personal history of transient ischemic attack (TIA), and cerebral infarction without residual deficits: Secondary | ICD-10-CM | POA: Diagnosis not present

## 2017-12-15 DIAGNOSIS — G9340 Encephalopathy, unspecified: Secondary | ICD-10-CM | POA: Diagnosis not present

## 2017-12-15 DIAGNOSIS — J80 Acute respiratory distress syndrome: Secondary | ICD-10-CM | POA: Diagnosis not present

## 2017-12-15 DIAGNOSIS — K118 Other diseases of salivary glands: Secondary | ICD-10-CM | POA: Diagnosis not present

## 2017-12-15 DIAGNOSIS — D62 Acute posthemorrhagic anemia: Secondary | ICD-10-CM | POA: Diagnosis not present

## 2017-12-15 DIAGNOSIS — J810 Acute pulmonary edema: Secondary | ICD-10-CM | POA: Diagnosis not present

## 2017-12-15 DIAGNOSIS — R1310 Dysphagia, unspecified: Secondary | ICD-10-CM | POA: Diagnosis not present

## 2017-12-15 DIAGNOSIS — G935 Compression of brain: Secondary | ICD-10-CM | POA: Diagnosis not present

## 2017-12-15 DIAGNOSIS — Z430 Encounter for attention to tracheostomy: Secondary | ICD-10-CM | POA: Diagnosis not present

## 2017-12-15 DIAGNOSIS — I69315 Cognitive social or emotional deficit following cerebral infarction: Secondary | ICD-10-CM | POA: Diagnosis not present

## 2017-12-15 DIAGNOSIS — J90 Pleural effusion, not elsewhere classified: Secondary | ICD-10-CM | POA: Diagnosis not present

## 2017-12-15 DIAGNOSIS — R569 Unspecified convulsions: Secondary | ICD-10-CM | POA: Diagnosis not present

## 2017-12-15 DIAGNOSIS — Z931 Gastrostomy status: Secondary | ICD-10-CM | POA: Diagnosis not present

## 2017-12-15 DIAGNOSIS — R001 Bradycardia, unspecified: Secondary | ICD-10-CM | POA: Diagnosis not present

## 2017-12-15 DIAGNOSIS — J9600 Acute respiratory failure, unspecified whether with hypoxia or hypercapnia: Secondary | ICD-10-CM | POA: Diagnosis not present

## 2017-12-15 DIAGNOSIS — I161 Hypertensive emergency: Secondary | ICD-10-CM | POA: Diagnosis not present

## 2017-12-15 DIAGNOSIS — R402112 Coma scale, eyes open, never, at arrival to emergency department: Secondary | ICD-10-CM | POA: Diagnosis not present

## 2017-12-15 DIAGNOSIS — I618 Other nontraumatic intracerebral hemorrhage: Secondary | ICD-10-CM | POA: Diagnosis not present

## 2017-12-15 DIAGNOSIS — G9389 Other specified disorders of brain: Secondary | ICD-10-CM | POA: Diagnosis not present

## 2017-12-15 DIAGNOSIS — G936 Cerebral edema: Secondary | ICD-10-CM | POA: Diagnosis not present

## 2017-12-15 DIAGNOSIS — Z7409 Other reduced mobility: Secondary | ICD-10-CM | POA: Diagnosis not present

## 2017-12-15 DIAGNOSIS — R4701 Aphasia: Secondary | ICD-10-CM | POA: Diagnosis not present

## 2017-12-15 DIAGNOSIS — R402212 Coma scale, best verbal response, none, at arrival to emergency department: Secondary | ICD-10-CM | POA: Diagnosis not present

## 2017-12-15 DIAGNOSIS — R1312 Dysphagia, oropharyngeal phase: Secondary | ICD-10-CM | POA: Diagnosis not present

## 2017-12-15 DIAGNOSIS — J95851 Ventilator associated pneumonia: Secondary | ICD-10-CM | POA: Diagnosis not present

## 2017-12-15 DIAGNOSIS — Z4682 Encounter for fitting and adjustment of non-vascular catheter: Secondary | ICD-10-CM | POA: Diagnosis not present

## 2017-12-15 DIAGNOSIS — I6200 Nontraumatic subdural hemorrhage, unspecified: Secondary | ICD-10-CM | POA: Diagnosis not present

## 2017-12-15 DIAGNOSIS — E119 Type 2 diabetes mellitus without complications: Secondary | ICD-10-CM | POA: Diagnosis not present

## 2017-12-15 DIAGNOSIS — E872 Acidosis: Secondary | ICD-10-CM | POA: Diagnosis not present

## 2017-12-15 DIAGNOSIS — J939 Pneumothorax, unspecified: Secondary | ICD-10-CM | POA: Diagnosis not present

## 2017-12-15 DIAGNOSIS — I517 Cardiomegaly: Secondary | ICD-10-CM | POA: Diagnosis not present

## 2017-12-15 DIAGNOSIS — J9690 Respiratory failure, unspecified, unspecified whether with hypoxia or hypercapnia: Secondary | ICD-10-CM | POA: Diagnosis not present

## 2017-12-15 DIAGNOSIS — E876 Hypokalemia: Secondary | ICD-10-CM | POA: Diagnosis not present

## 2017-12-15 DIAGNOSIS — I69191 Dysphagia following nontraumatic intracerebral hemorrhage: Secondary | ICD-10-CM | POA: Diagnosis not present

## 2017-12-15 DIAGNOSIS — I444 Left anterior fascicular block: Secondary | ICD-10-CM | POA: Diagnosis not present

## 2017-12-15 DIAGNOSIS — E878 Other disorders of electrolyte and fluid balance, not elsewhere classified: Secondary | ICD-10-CM | POA: Diagnosis not present

## 2017-12-15 DIAGNOSIS — R000 Tachycardia, unspecified: Secondary | ICD-10-CM | POA: Diagnosis not present

## 2017-12-15 DIAGNOSIS — R918 Other nonspecific abnormal finding of lung field: Secondary | ICD-10-CM | POA: Diagnosis not present

## 2017-12-15 DIAGNOSIS — I69391 Dysphagia following cerebral infarction: Secondary | ICD-10-CM | POA: Diagnosis not present

## 2017-12-15 DIAGNOSIS — R451 Restlessness and agitation: Secondary | ICD-10-CM | POA: Diagnosis not present

## 2017-12-15 DIAGNOSIS — R4182 Altered mental status, unspecified: Secondary | ICD-10-CM | POA: Diagnosis not present

## 2017-12-15 DIAGNOSIS — I10 Essential (primary) hypertension: Secondary | ICD-10-CM | POA: Diagnosis not present

## 2017-12-15 DIAGNOSIS — Z888 Allergy status to other drugs, medicaments and biological substances status: Secondary | ICD-10-CM | POA: Diagnosis not present

## 2017-12-15 DIAGNOSIS — I6201 Nontraumatic acute subdural hemorrhage: Secondary | ICD-10-CM | POA: Diagnosis not present

## 2017-12-15 DIAGNOSIS — R279 Unspecified lack of coordination: Secondary | ICD-10-CM | POA: Diagnosis not present

## 2018-01-11 DIAGNOSIS — R569 Unspecified convulsions: Secondary | ICD-10-CM | POA: Diagnosis not present

## 2018-01-11 DIAGNOSIS — I69191 Dysphagia following nontraumatic intracerebral hemorrhage: Secondary | ICD-10-CM | POA: Diagnosis not present

## 2018-01-11 DIAGNOSIS — G43809 Other migraine, not intractable, without status migrainosus: Secondary | ICD-10-CM | POA: Diagnosis not present

## 2018-01-11 DIAGNOSIS — I69115 Cognitive social or emotional deficit following nontraumatic intracerebral hemorrhage: Secondary | ICD-10-CM | POA: Diagnosis not present

## 2018-01-11 DIAGNOSIS — R1312 Dysphagia, oropharyngeal phase: Secondary | ICD-10-CM | POA: Diagnosis not present

## 2018-01-11 DIAGNOSIS — I639 Cerebral infarction, unspecified: Secondary | ICD-10-CM | POA: Diagnosis not present

## 2018-01-11 DIAGNOSIS — I69315 Cognitive social or emotional deficit following cerebral infarction: Secondary | ICD-10-CM | POA: Diagnosis not present

## 2018-01-11 DIAGNOSIS — I69111 Memory deficit following nontraumatic intracerebral hemorrhage: Secondary | ICD-10-CM | POA: Diagnosis not present

## 2018-01-11 DIAGNOSIS — I69320 Aphasia following cerebral infarction: Secondary | ICD-10-CM | POA: Diagnosis not present

## 2018-01-11 DIAGNOSIS — Z743 Need for continuous supervision: Secondary | ICD-10-CM | POA: Diagnosis not present

## 2018-01-11 DIAGNOSIS — I69391 Dysphagia following cerebral infarction: Secondary | ICD-10-CM | POA: Diagnosis not present

## 2018-01-11 DIAGNOSIS — J9600 Acute respiratory failure, unspecified whether with hypoxia or hypercapnia: Secondary | ICD-10-CM | POA: Diagnosis not present

## 2018-01-11 DIAGNOSIS — R279 Unspecified lack of coordination: Secondary | ICD-10-CM | POA: Diagnosis not present

## 2018-01-11 DIAGNOSIS — M6281 Muscle weakness (generalized): Secondary | ICD-10-CM | POA: Diagnosis not present

## 2018-02-12 DIAGNOSIS — G9389 Other specified disorders of brain: Secondary | ICD-10-CM | POA: Diagnosis not present

## 2018-02-12 DIAGNOSIS — S065X9A Traumatic subdural hemorrhage with loss of consciousness of unspecified duration, initial encounter: Secondary | ICD-10-CM | POA: Diagnosis not present

## 2018-02-12 DIAGNOSIS — R930 Abnormal findings on diagnostic imaging of skull and head, not elsewhere classified: Secondary | ICD-10-CM | POA: Diagnosis not present

## 2018-02-12 DIAGNOSIS — S065X9D Traumatic subdural hemorrhage with loss of consciousness of unspecified duration, subsequent encounter: Secondary | ICD-10-CM | POA: Diagnosis not present

## 2018-02-23 DIAGNOSIS — Z8673 Personal history of transient ischemic attack (TIA), and cerebral infarction without residual deficits: Secondary | ICD-10-CM | POA: Diagnosis not present

## 2018-02-23 DIAGNOSIS — G9340 Encephalopathy, unspecified: Secondary | ICD-10-CM | POA: Diagnosis not present

## 2018-02-23 DIAGNOSIS — Z743 Need for continuous supervision: Secondary | ICD-10-CM | POA: Diagnosis not present

## 2018-02-23 DIAGNOSIS — E1142 Type 2 diabetes mellitus with diabetic polyneuropathy: Secondary | ICD-10-CM | POA: Diagnosis not present

## 2018-02-23 DIAGNOSIS — R41841 Cognitive communication deficit: Secondary | ICD-10-CM | POA: Diagnosis not present

## 2018-02-23 DIAGNOSIS — Z8679 Personal history of other diseases of the circulatory system: Secondary | ICD-10-CM | POA: Diagnosis not present

## 2018-02-23 DIAGNOSIS — Z960 Presence of urogenital implants: Secondary | ICD-10-CM | POA: Diagnosis not present

## 2018-02-23 DIAGNOSIS — F329 Major depressive disorder, single episode, unspecified: Secondary | ICD-10-CM | POA: Diagnosis not present

## 2018-02-23 DIAGNOSIS — G4733 Obstructive sleep apnea (adult) (pediatric): Secondary | ICD-10-CM | POA: Diagnosis not present

## 2018-02-23 DIAGNOSIS — R2681 Unsteadiness on feet: Secondary | ICD-10-CM | POA: Diagnosis not present

## 2018-02-23 DIAGNOSIS — M952 Other acquired deformity of head: Secondary | ICD-10-CM | POA: Diagnosis not present

## 2018-02-23 DIAGNOSIS — G43909 Migraine, unspecified, not intractable, without status migrainosus: Secondary | ICD-10-CM | POA: Diagnosis not present

## 2018-02-23 DIAGNOSIS — R279 Unspecified lack of coordination: Secondary | ICD-10-CM | POA: Diagnosis not present

## 2018-02-23 DIAGNOSIS — G43109 Migraine with aura, not intractable, without status migrainosus: Secondary | ICD-10-CM | POA: Diagnosis not present

## 2018-02-23 DIAGNOSIS — I10 Essential (primary) hypertension: Secondary | ICD-10-CM | POA: Diagnosis not present

## 2018-02-23 DIAGNOSIS — R9431 Abnormal electrocardiogram [ECG] [EKG]: Secondary | ICD-10-CM | POA: Diagnosis not present

## 2018-02-23 DIAGNOSIS — Z9889 Other specified postprocedural states: Secondary | ICD-10-CM | POA: Diagnosis not present

## 2018-02-23 DIAGNOSIS — I517 Cardiomegaly: Secondary | ICD-10-CM | POA: Diagnosis not present

## 2018-02-23 DIAGNOSIS — Z48811 Encounter for surgical aftercare following surgery on the nervous system: Secondary | ICD-10-CM | POA: Diagnosis not present

## 2018-02-23 DIAGNOSIS — R1312 Dysphagia, oropharyngeal phase: Secondary | ICD-10-CM | POA: Diagnosis not present

## 2018-02-23 DIAGNOSIS — M6281 Muscle weakness (generalized): Secondary | ICD-10-CM | POA: Diagnosis not present

## 2018-02-23 DIAGNOSIS — R569 Unspecified convulsions: Secondary | ICD-10-CM | POA: Diagnosis not present

## 2018-02-23 DIAGNOSIS — Z9989 Dependence on other enabling machines and devices: Secondary | ICD-10-CM | POA: Diagnosis not present

## 2018-02-23 DIAGNOSIS — I69191 Dysphagia following nontraumatic intracerebral hemorrhage: Secondary | ICD-10-CM | POA: Diagnosis not present

## 2018-02-23 DIAGNOSIS — K219 Gastro-esophageal reflux disease without esophagitis: Secondary | ICD-10-CM | POA: Diagnosis not present

## 2018-02-23 DIAGNOSIS — E1165 Type 2 diabetes mellitus with hyperglycemia: Secondary | ICD-10-CM | POA: Diagnosis not present

## 2018-02-23 DIAGNOSIS — Q750 Craniosynostosis: Secondary | ICD-10-CM | POA: Diagnosis not present

## 2018-02-23 DIAGNOSIS — Z8669 Personal history of other diseases of the nervous system and sense organs: Secondary | ICD-10-CM | POA: Diagnosis not present

## 2018-02-23 DIAGNOSIS — E785 Hyperlipidemia, unspecified: Secondary | ICD-10-CM | POA: Diagnosis not present

## 2018-02-23 DIAGNOSIS — G43809 Other migraine, not intractable, without status migrainosus: Secondary | ICD-10-CM | POA: Diagnosis not present

## 2018-02-23 DIAGNOSIS — G629 Polyneuropathy, unspecified: Secondary | ICD-10-CM | POA: Diagnosis not present

## 2018-02-23 DIAGNOSIS — I69320 Aphasia following cerebral infarction: Secondary | ICD-10-CM | POA: Diagnosis not present

## 2018-02-23 DIAGNOSIS — E119 Type 2 diabetes mellitus without complications: Secondary | ICD-10-CM | POA: Diagnosis not present

## 2018-02-23 DIAGNOSIS — E876 Hypokalemia: Secondary | ICD-10-CM | POA: Diagnosis not present

## 2018-02-28 DIAGNOSIS — F329 Major depressive disorder, single episode, unspecified: Secondary | ICD-10-CM | POA: Diagnosis not present

## 2018-02-28 DIAGNOSIS — R279 Unspecified lack of coordination: Secondary | ICD-10-CM | POA: Diagnosis not present

## 2018-02-28 DIAGNOSIS — E119 Type 2 diabetes mellitus without complications: Secondary | ICD-10-CM | POA: Diagnosis not present

## 2018-02-28 DIAGNOSIS — I69320 Aphasia following cerebral infarction: Secondary | ICD-10-CM | POA: Diagnosis not present

## 2018-02-28 DIAGNOSIS — R569 Unspecified convulsions: Secondary | ICD-10-CM | POA: Diagnosis not present

## 2018-02-28 DIAGNOSIS — M6281 Muscle weakness (generalized): Secondary | ICD-10-CM | POA: Diagnosis not present

## 2018-02-28 DIAGNOSIS — R2681 Unsteadiness on feet: Secondary | ICD-10-CM | POA: Diagnosis not present

## 2018-02-28 DIAGNOSIS — R41841 Cognitive communication deficit: Secondary | ICD-10-CM | POA: Diagnosis not present

## 2018-02-28 DIAGNOSIS — I69191 Dysphagia following nontraumatic intracerebral hemorrhage: Secondary | ICD-10-CM | POA: Diagnosis not present

## 2018-02-28 DIAGNOSIS — R1312 Dysphagia, oropharyngeal phase: Secondary | ICD-10-CM | POA: Diagnosis not present

## 2018-02-28 DIAGNOSIS — Q750 Craniosynostosis: Secondary | ICD-10-CM | POA: Diagnosis not present

## 2018-02-28 DIAGNOSIS — Z743 Need for continuous supervision: Secondary | ICD-10-CM | POA: Diagnosis not present

## 2018-02-28 DIAGNOSIS — G43809 Other migraine, not intractable, without status migrainosus: Secondary | ICD-10-CM | POA: Diagnosis not present

## 2018-03-24 DIAGNOSIS — I69820 Aphasia following other cerebrovascular disease: Secondary | ICD-10-CM | POA: Diagnosis not present

## 2018-03-24 DIAGNOSIS — R41841 Cognitive communication deficit: Secondary | ICD-10-CM | POA: Diagnosis not present

## 2018-03-24 DIAGNOSIS — Z4802 Encounter for removal of sutures: Secondary | ICD-10-CM | POA: Diagnosis not present

## 2018-03-29 DIAGNOSIS — R41841 Cognitive communication deficit: Secondary | ICD-10-CM | POA: Diagnosis not present

## 2018-04-08 DIAGNOSIS — G2581 Restless legs syndrome: Secondary | ICD-10-CM | POA: Diagnosis not present

## 2018-04-08 DIAGNOSIS — R262 Difficulty in walking, not elsewhere classified: Secondary | ICD-10-CM | POA: Diagnosis not present

## 2018-04-08 DIAGNOSIS — S065X0A Traumatic subdural hemorrhage without loss of consciousness, initial encounter: Secondary | ICD-10-CM | POA: Diagnosis not present

## 2018-04-08 DIAGNOSIS — E1140 Type 2 diabetes mellitus with diabetic neuropathy, unspecified: Secondary | ICD-10-CM | POA: Diagnosis not present

## 2018-04-14 DIAGNOSIS — Z79899 Other long term (current) drug therapy: Secondary | ICD-10-CM | POA: Diagnosis not present

## 2018-04-22 DIAGNOSIS — S065X0D Traumatic subdural hemorrhage without loss of consciousness, subsequent encounter: Secondary | ICD-10-CM | POA: Diagnosis not present

## 2018-04-22 DIAGNOSIS — E1140 Type 2 diabetes mellitus with diabetic neuropathy, unspecified: Secondary | ICD-10-CM | POA: Diagnosis not present

## 2018-04-22 DIAGNOSIS — R51 Headache: Secondary | ICD-10-CM | POA: Diagnosis not present

## 2018-04-22 DIAGNOSIS — R609 Edema, unspecified: Secondary | ICD-10-CM | POA: Diagnosis not present

## 2018-04-23 DIAGNOSIS — R51 Headache: Secondary | ICD-10-CM | POA: Diagnosis not present

## 2018-04-23 DIAGNOSIS — R609 Edema, unspecified: Secondary | ICD-10-CM | POA: Diagnosis not present

## 2018-04-23 DIAGNOSIS — Z9889 Other specified postprocedural states: Secondary | ICD-10-CM | POA: Diagnosis not present

## 2018-05-13 DIAGNOSIS — R609 Edema, unspecified: Secondary | ICD-10-CM | POA: Diagnosis not present

## 2018-05-13 DIAGNOSIS — S065X0D Traumatic subdural hemorrhage without loss of consciousness, subsequent encounter: Secondary | ICD-10-CM | POA: Diagnosis not present

## 2018-05-13 DIAGNOSIS — G4730 Sleep apnea, unspecified: Secondary | ICD-10-CM | POA: Diagnosis not present

## 2018-05-13 DIAGNOSIS — R51 Headache: Secondary | ICD-10-CM | POA: Diagnosis not present

## 2018-05-19 DIAGNOSIS — R609 Edema, unspecified: Secondary | ICD-10-CM | POA: Diagnosis not present

## 2018-05-19 DIAGNOSIS — I10 Essential (primary) hypertension: Secondary | ICD-10-CM | POA: Diagnosis not present

## 2018-06-10 DIAGNOSIS — E1140 Type 2 diabetes mellitus with diabetic neuropathy, unspecified: Secondary | ICD-10-CM | POA: Diagnosis not present

## 2018-06-10 DIAGNOSIS — R51 Headache: Secondary | ICD-10-CM | POA: Diagnosis not present

## 2018-06-10 DIAGNOSIS — G4730 Sleep apnea, unspecified: Secondary | ICD-10-CM | POA: Diagnosis not present

## 2018-06-10 DIAGNOSIS — S065X0D Traumatic subdural hemorrhage without loss of consciousness, subsequent encounter: Secondary | ICD-10-CM | POA: Diagnosis not present

## 2018-07-01 DIAGNOSIS — S065X0D Traumatic subdural hemorrhage without loss of consciousness, subsequent encounter: Secondary | ICD-10-CM | POA: Diagnosis not present

## 2018-07-01 DIAGNOSIS — E1140 Type 2 diabetes mellitus with diabetic neuropathy, unspecified: Secondary | ICD-10-CM | POA: Diagnosis not present

## 2018-07-01 DIAGNOSIS — I10 Essential (primary) hypertension: Secondary | ICD-10-CM | POA: Diagnosis not present

## 2018-07-01 DIAGNOSIS — G4730 Sleep apnea, unspecified: Secondary | ICD-10-CM | POA: Diagnosis not present

## 2018-09-18 IMAGING — MR MR HEAD W/O CM
9 of 10 series · 37 of 48 positions shown · non-contrast
Comparison: Head CT without contrast 2545 hours today. Brain MRI
03/02/2016 and earlier.

CLINICAL DATA: 65-year-old female with Slurred speech, dysarthria.
Mild left facial droop. Headache.

EXAM:
MRI HEAD WITHOUT CONTRAST
TECHNIQUE: Multiplanar, multiecho pulse sequences of the brain and surrounding
structures were obtained without intravenous contrast.

[Series 3: DWI · axial · 3.0mm · 1.09mm/px · z∈[-108,+23]mm · 11 of 90 slices shown (1 of 4)]
[im 1/90]
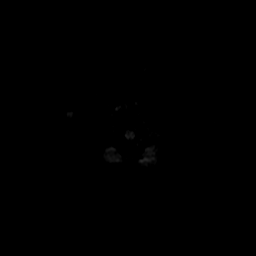
[im 9/90]
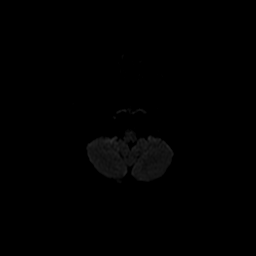
[im 18/90]
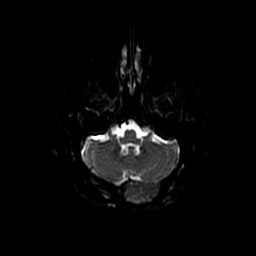
[im 27/90]
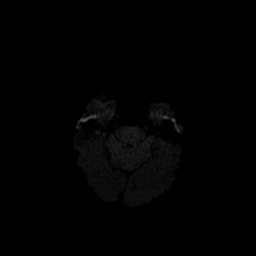
[im 36/90]
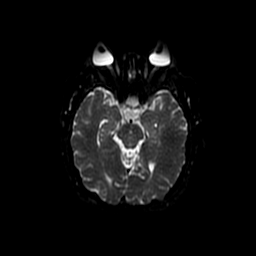
[im 45/90]
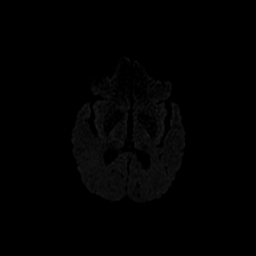
[im 54/90]
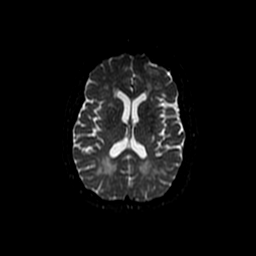
[im 63/90]
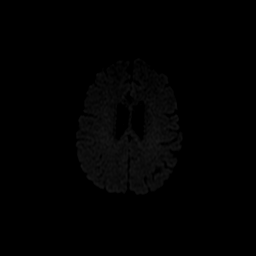
[im 72/90]
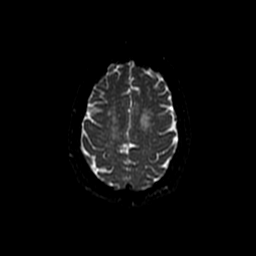
[im 81/90]
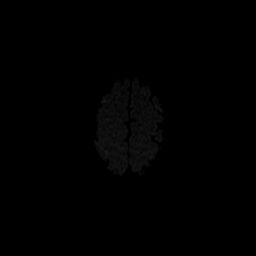
[im 90/90]
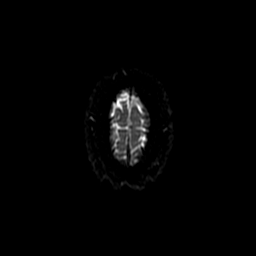

[Series 4: T2 · axial · 5.0mm · 0.43mm/px · z∈[-108,+24]mm · 2 of 23 slices shown]
[im 1/23]
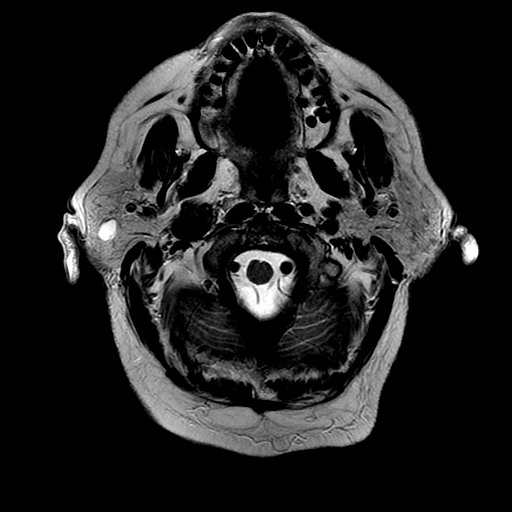
[im 23/23]
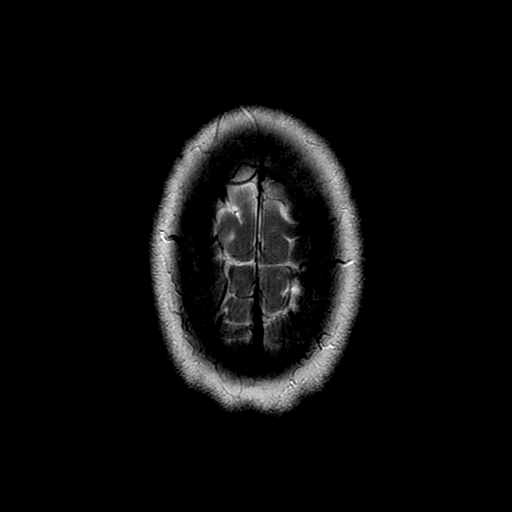

[Series 5: T1 · sagittal · 5.0mm · 0.47mm/px · 2 of 23 slices shown]
[im 1/23]
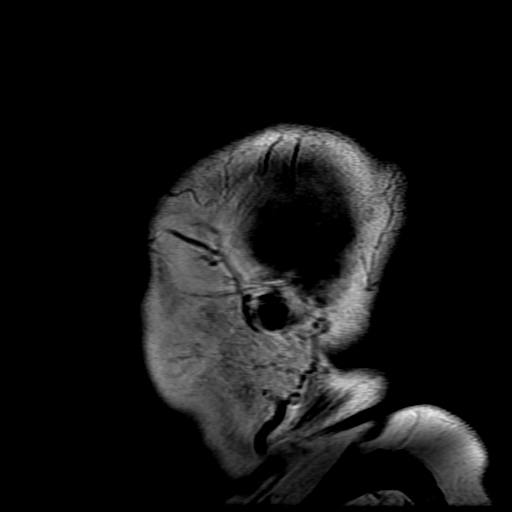
[im 23/23]
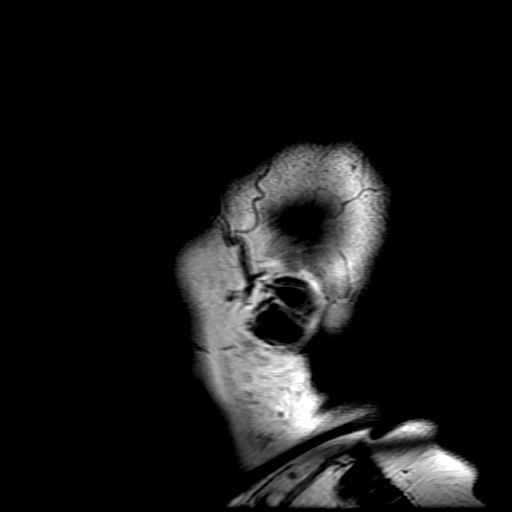

[Series 6: DWI · coronal · 5.0mm · 1.09mm/px · 7 of 66 slices shown (2 of 4)]
[im 1/66]
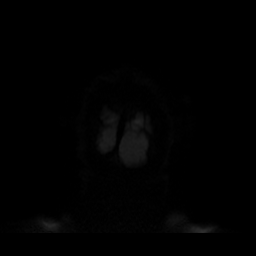
[im 11/66]
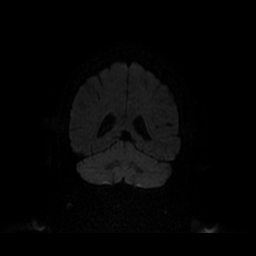
[im 22/66]
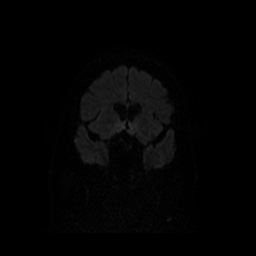
[im 33/66]
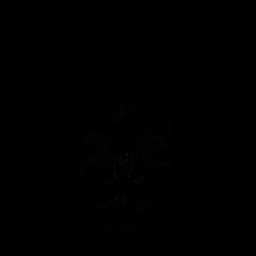
[im 44/66]
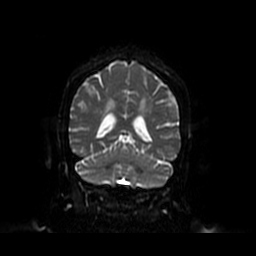
[im 55/66]
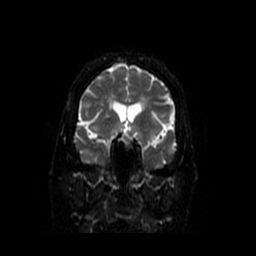
[im 66/66]
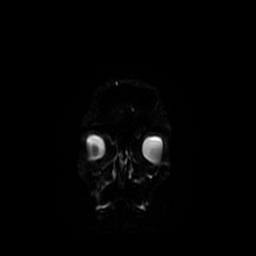

[Series 7: FLAIR · axial · 5.0mm · 0.43mm/px · z∈[-108,+24]mm · 2 of 23 slices shown]
[im 1/23]
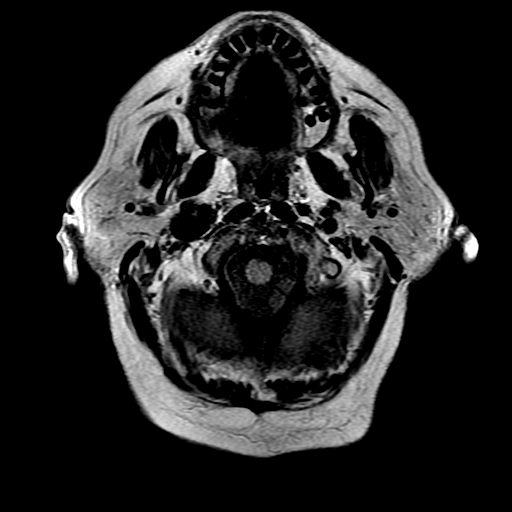
[im 23/23]
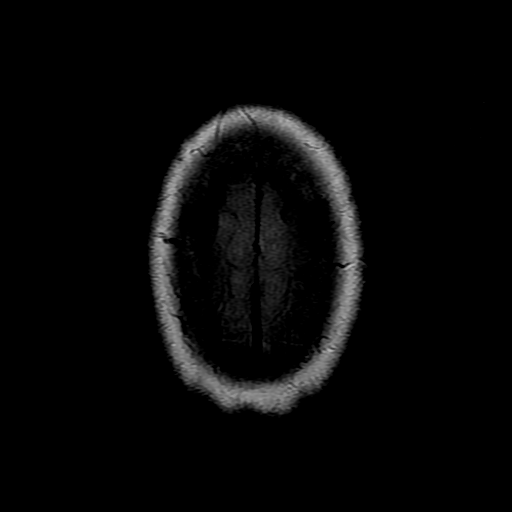

[Series 8: ax mpgr · axial · 5.0mm · 0.43mm/px · 1 of 23 slices shown]
[im 1/23]
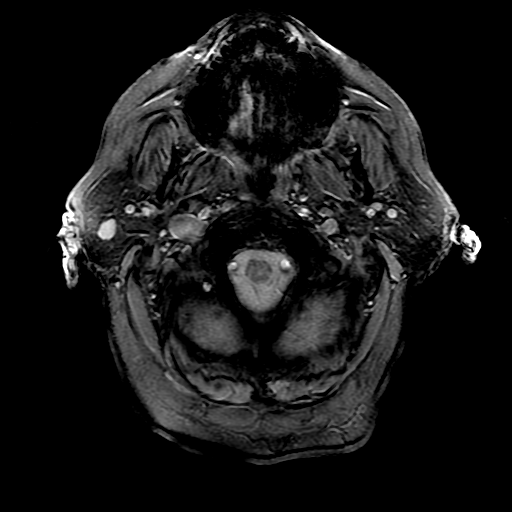

[Series 10: T2 post-contrast · coronal · 5.0mm · 0.39mm/px · 3 of 25 slices shown]
[im 1/25]
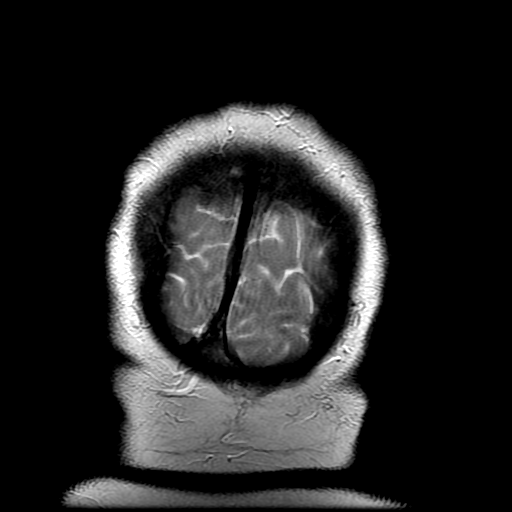
[im 13/25]
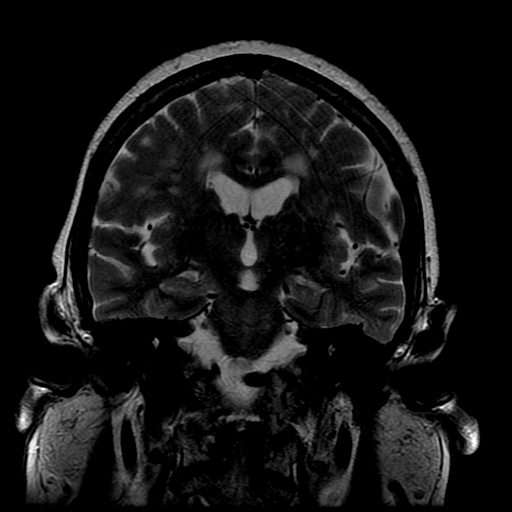
[im 25/25]
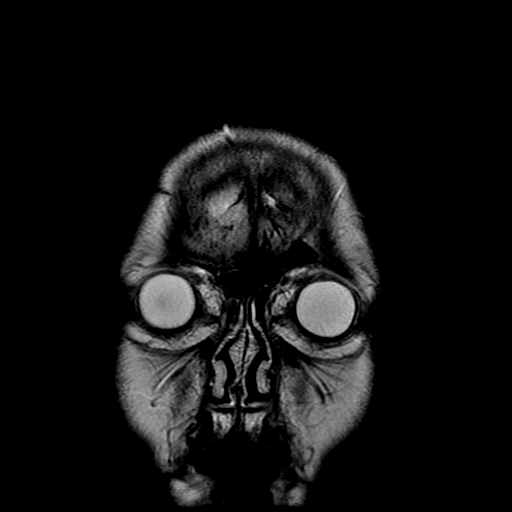

[Series 300: DWI · axial · 3.0mm · 1.09mm/px · z∈[-108,+23]mm · 5 of 45 slices shown (3 of 4)]
[im 1/45]
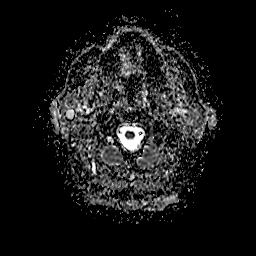
[im 12/45]
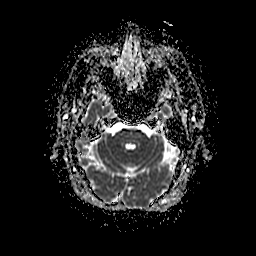
[im 23/45]
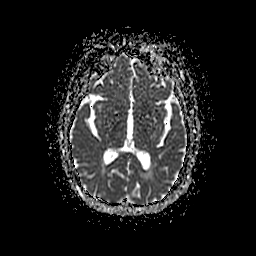
[im 34/45]
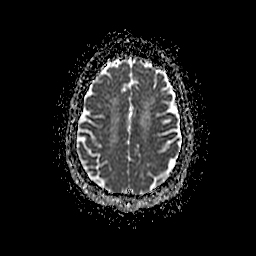
[im 45/45]
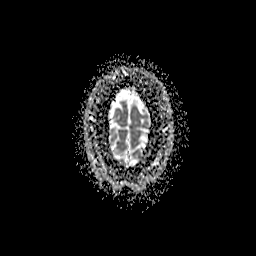

[Series 600: DWI · coronal · 5.0mm · 1.09mm/px · 4 of 33 slices shown (4 of 4)]
[im 1/33]
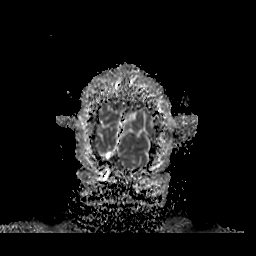
[im 11/33]
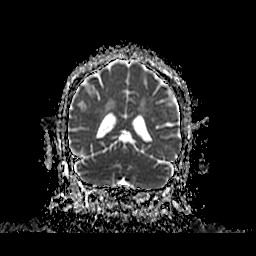
[im 22/33]
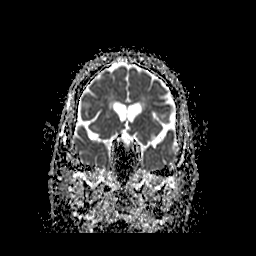
[im 33/33]
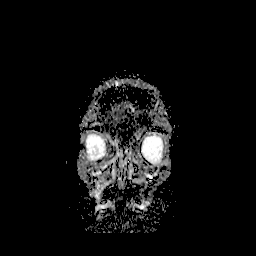

[37 of 48 positions shown; findings below may reference images not displayed]

FINDINGS: Brain: No restricted diffusion to suggest acute infarction. No
midline shift, mass effect, evidence of mass lesion,
ventriculomegaly, extra-axial collection or acute intracranial
hemorrhage. Cervicomedullary junction and pituitary are within
normal limits.

Patchy and confluent chronic cerebral white matter T2 and FLAIR
hyperintensity appears stable since 1559. Similar Patchy and
confluent T2 hyperintensity in the pons is stable. No cortical
encephalomalacia identified. No chronic cerebral blood products
identified. There is some deep white matter capsule involvement, but
the deep gray matter nuclei and cerebellum remain normal.

Vascular: Major intracranial vascular flow voids are stable since
1559. Mild generalized intracranial artery dolichoectasia.

Skull and upper cervical spine: Negative. Visualized bone marrow
signal is within normal limits.

Sinuses/Orbits: Normal orbits soft tissues.

Visualized paranasal sinuses and mastoids are stable and well
pneumatized.

Other: Visible internal auditory structures appear normal. An oval
10 mm T2 hyperintense right parotid gland nodule is is stable since
3525, compatible with a small benign cyst or neoplasm, of doubtful
significance.
IMPRESSION: 1.  No acute intracranial abnormality.
2. Stable chronically advanced nonspecific signal changes in the
cerebral white matter and pons, favor due to chronic small vessel
disease.

## 2022-05-28 DEATH — deceased
